# Patient Record
Sex: Male | Born: 1950 | Race: Black or African American | Hispanic: No | Marital: Single | Smoking: Former smoker
Health system: Southern US, Community
[De-identification: ages and names within clinical notes are randomized; demographics above are authoritative.]

## PROBLEM LIST (undated history)

## (undated) DIAGNOSIS — J449 Chronic obstructive pulmonary disease, unspecified: Secondary | ICD-10-CM

## (undated) DIAGNOSIS — E7800 Pure hypercholesterolemia, unspecified: Secondary | ICD-10-CM

## (undated) DIAGNOSIS — N289 Disorder of kidney and ureter, unspecified: Secondary | ICD-10-CM

## (undated) DIAGNOSIS — D649 Anemia, unspecified: Secondary | ICD-10-CM

## (undated) DIAGNOSIS — M1990 Unspecified osteoarthritis, unspecified site: Secondary | ICD-10-CM

## (undated) DIAGNOSIS — I2699 Other pulmonary embolism without acute cor pulmonale: Secondary | ICD-10-CM

## (undated) DIAGNOSIS — I10 Essential (primary) hypertension: Secondary | ICD-10-CM

## (undated) DIAGNOSIS — E782 Mixed hyperlipidemia: Secondary | ICD-10-CM

## (undated) DIAGNOSIS — M109 Gout, unspecified: Secondary | ICD-10-CM

## (undated) DIAGNOSIS — R0602 Shortness of breath: Secondary | ICD-10-CM

## (undated) DIAGNOSIS — J45909 Unspecified asthma, uncomplicated: Secondary | ICD-10-CM

## (undated) DIAGNOSIS — R251 Tremor, unspecified: Secondary | ICD-10-CM

## (undated) DIAGNOSIS — E790 Hyperuricemia without signs of inflammatory arthritis and tophaceous disease: Secondary | ICD-10-CM

## (undated) HISTORY — DX: Mixed hyperlipidemia: E78.2

## (undated) HISTORY — PX: OTHER SURGICAL HISTORY: SHX169

## (undated) HISTORY — DX: Essential (primary) hypertension: I10

## (undated) HISTORY — PX: ACHILLES TENDON REPAIR: SUR1153

## (undated) HISTORY — DX: Other pulmonary embolism without acute cor pulmonale: I26.99

## (undated) HISTORY — PX: CARDIAC CATHETERIZATION: SHX172

---
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
## 2006-07-23 ENCOUNTER — Ambulatory Visit: Payer: Self-pay | Admitting: Cardiology

## 2006-08-12 ENCOUNTER — Ambulatory Visit: Payer: Self-pay | Admitting: Cardiology

## 2006-08-25 ENCOUNTER — Ambulatory Visit: Payer: Self-pay | Admitting: Cardiology

## 2006-09-03 ENCOUNTER — Inpatient Hospital Stay (HOSPITAL_BASED_OUTPATIENT_CLINIC_OR_DEPARTMENT_OTHER): Admission: RE | Admit: 2006-09-03 | Discharge: 2006-09-03 | Payer: Self-pay | Admitting: Cardiology

## 2006-09-03 ENCOUNTER — Ambulatory Visit: Payer: Self-pay | Admitting: Cardiology

## 2006-09-11 ENCOUNTER — Ambulatory Visit: Payer: Self-pay | Admitting: Cardiology

## 2011-12-30 HISTORY — PX: CARDIAC CATHETERIZATION: SHX172

## 2011-12-30 HISTORY — PX: ACHILLES TENDON REPAIR: SUR1153

## 2012-08-20 DIAGNOSIS — R0602 Shortness of breath: Secondary | ICD-10-CM

## 2012-09-02 ENCOUNTER — Emergency Department (HOSPITAL_COMMUNITY): Payer: 59

## 2012-09-02 ENCOUNTER — Encounter (HOSPITAL_COMMUNITY): Payer: Self-pay

## 2012-09-02 ENCOUNTER — Inpatient Hospital Stay (HOSPITAL_COMMUNITY)
Admission: EM | Admit: 2012-09-02 | Discharge: 2012-09-05 | DRG: 176 | Disposition: A | Discharge status: Home / Self Care | Payer: 59 | Attending: Internal Medicine | Admitting: Internal Medicine

## 2012-09-02 DIAGNOSIS — Y9210 Unspecified residential institution as the place of occurrence of the external cause: Secondary | ICD-10-CM | POA: Diagnosis present

## 2012-09-02 DIAGNOSIS — R000 Tachycardia, unspecified: Secondary | ICD-10-CM | POA: Diagnosis present

## 2012-09-02 DIAGNOSIS — I2692 Saddle embolus of pulmonary artery without acute cor pulmonale: Principal | ICD-10-CM

## 2012-09-02 DIAGNOSIS — N189 Chronic kidney disease, unspecified: Secondary | ICD-10-CM

## 2012-09-02 DIAGNOSIS — Z79899 Other long term (current) drug therapy: Secondary | ICD-10-CM

## 2012-09-02 DIAGNOSIS — Z8739 Personal history of other diseases of the musculoskeletal system and connective tissue: Secondary | ICD-10-CM

## 2012-09-02 DIAGNOSIS — G4440 Drug-induced headache, not elsewhere classified, not intractable: Secondary | ICD-10-CM | POA: Diagnosis present

## 2012-09-02 DIAGNOSIS — M129 Arthropathy, unspecified: Secondary | ICD-10-CM | POA: Diagnosis present

## 2012-09-02 DIAGNOSIS — I10 Essential (primary) hypertension: Secondary | ICD-10-CM

## 2012-09-02 DIAGNOSIS — R0600 Dyspnea, unspecified: Secondary | ICD-10-CM | POA: Diagnosis present

## 2012-09-02 DIAGNOSIS — I129 Hypertensive chronic kidney disease with stage 1 through stage 4 chronic kidney disease, or unspecified chronic kidney disease: Secondary | ICD-10-CM | POA: Diagnosis present

## 2012-09-02 DIAGNOSIS — D649 Anemia, unspecified: Secondary | ICD-10-CM

## 2012-09-02 DIAGNOSIS — I82409 Acute embolism and thrombosis of unspecified deep veins of unspecified lower extremity: Secondary | ICD-10-CM | POA: Diagnosis present

## 2012-09-02 DIAGNOSIS — E7800 Pure hypercholesterolemia, unspecified: Secondary | ICD-10-CM | POA: Diagnosis present

## 2012-09-02 DIAGNOSIS — M109 Gout, unspecified: Secondary | ICD-10-CM | POA: Diagnosis present

## 2012-09-02 DIAGNOSIS — N183 Chronic kidney disease, stage 3 unspecified: Secondary | ICD-10-CM

## 2012-09-02 DIAGNOSIS — I2699 Other pulmonary embolism without acute cor pulmonale: Secondary | ICD-10-CM

## 2012-09-02 DIAGNOSIS — T45515A Adverse effect of anticoagulants, initial encounter: Secondary | ICD-10-CM | POA: Diagnosis present

## 2012-09-02 DIAGNOSIS — E785 Hyperlipidemia, unspecified: Secondary | ICD-10-CM

## 2012-09-02 HISTORY — DX: Hyperuricemia without signs of inflammatory arthritis and tophaceous disease: E79.0

## 2012-09-02 HISTORY — DX: Pure hypercholesterolemia, unspecified: E78.00

## 2012-09-02 HISTORY — DX: Chronic obstructive pulmonary disease, unspecified: J44.9

## 2012-09-02 HISTORY — DX: Essential (primary) hypertension: I10

## 2012-09-02 HISTORY — DX: Tremor, unspecified: R25.1

## 2012-09-02 HISTORY — DX: Anemia, unspecified: D64.9

## 2012-09-02 HISTORY — DX: Gout, unspecified: M10.9

## 2012-09-02 HISTORY — DX: Disorder of kidney and ureter, unspecified: N28.9

## 2012-09-02 HISTORY — DX: Unspecified osteoarthritis, unspecified site: M19.90

## 2012-09-02 HISTORY — DX: Unspecified asthma, uncomplicated: J45.909

## 2012-09-02 HISTORY — DX: Shortness of breath: R06.02

## 2012-09-02 LAB — CBC WITH DIFFERENTIAL/PLATELET
Basophils Absolute: 0 10*3/uL (ref 0.0–0.1)
Basophils Relative: 0 % (ref 0–1)
HCT: 44 % (ref 39.0–52.0)
Lymphocytes Relative: 12 % (ref 12–46)
Neutro Abs: 8.2 10*3/uL — ABNORMAL HIGH (ref 1.7–7.7)
Neutrophils Relative %: 77 % (ref 43–77)
Platelets: 235 10*3/uL (ref 150–400)
RDW: 13.2 % (ref 11.5–15.5)
WBC: 10.7 10*3/uL — ABNORMAL HIGH (ref 4.0–10.5)

## 2012-09-02 LAB — PROTIME-INR: Prothrombin Time: 15.8 seconds — ABNORMAL HIGH (ref 11.6–15.2)

## 2012-09-02 LAB — COMPREHENSIVE METABOLIC PANEL
Alkaline Phosphatase: 120 U/L — ABNORMAL HIGH (ref 39–117)
BUN: 18 mg/dL (ref 6–23)
CO2: 25 mEq/L (ref 19–32)
GFR calc Af Amer: 50 mL/min — ABNORMAL LOW (ref >90)
GFR calc non Af Amer: 43 mL/min — ABNORMAL LOW (ref >90)
Glucose, Bld: 89 mg/dL (ref 70–99)
Potassium: 4.4 mEq/L (ref 3.5–5.1)
Total Bilirubin: 0.7 mg/dL (ref 0.3–1.2)
Total Protein: 7.9 g/dL (ref 6.0–8.3)

## 2012-09-02 LAB — D-DIMER, QUANTITATIVE: D-Dimer, Quant: 11.41 ug/mL-FEU — ABNORMAL HIGH (ref 0.00–0.48)

## 2012-09-02 LAB — TROPONIN I: Troponin I: <0.3 ng/mL (ref <0.30)

## 2012-09-02 LAB — PRO B NATRIURETIC PEPTIDE: Pro B Natriuretic peptide (BNP): 722.9 pg/mL — ABNORMAL HIGH (ref 0–125)

## 2012-09-02 MED ORDER — HEPARIN BOLUS VIA INFUSION: 80.0000 [IU]/kg | Freq: Once | INTRAVENOUS | Status: DC

## 2012-09-02 MED ORDER — HEPARIN BOLUS VIA INFUSION
5000.0000 [IU] | Freq: Once | INTRAVENOUS | Status: AC
  Administered 2012-09-02: 5000 [IU] via INTRAVENOUS

## 2012-09-02 MED ORDER — SODIUM CHLORIDE 0.9 % IV BOLUS (SEPSIS)
500.0000 mL | Freq: Once | INTRAVENOUS | Status: AC
  Administered 2012-09-02: 500 mL via INTRAVENOUS

## 2012-09-02 MED ORDER — IOHEXOL 350 MG/ML SOLN
100.0000 mL | Freq: Once | INTRAVENOUS | Status: AC | PRN
  Administered 2012-09-02: 100 mL via INTRAVENOUS

## 2012-09-02 MED ORDER — HEPARIN (PORCINE) IN NACL 100-0.45 UNIT/ML-% IJ SOLN: 18.0000 [IU]/kg/h | INTRAMUSCULAR | Status: DC

## 2012-09-02 MED ORDER — HEPARIN (PORCINE) IN NACL 100-0.45 UNIT/ML-% IJ SOLN
1750.0000 [IU]/h | INTRAMUSCULAR | Status: AC
  Administered 2012-09-02: 1550 [IU]/h via INTRAVENOUS
  Administered 2012-09-03: 1750 [IU]/h via INTRAVENOUS
  Filled 2012-09-02 (×3): qty 250

## 2012-09-02 NOTE — ED Notes (Signed)
Pt reports SOB, pain to (L) back in the rib cage, fevers, heart palpitations, fatigue and night sweats starting August 15th, pt had surgery to (R) achillis tendon August 6. Pt sent to ED d/t elevated BNP. Pt brought lab report w/him, BNP was not included in lab report. Pt reports waking up in the middle of the night w/sudden onset of (R) leg pain 2 nights ago, pt's orthopedic evaluated pt's RLE in office

## 2012-09-02 NOTE — ED Notes (Signed)
Pt also reports chest wall discomfort and a dry cough x1 month, pt reports the cough started after leaving the hospital but has gotten progressively worse over the last few days, increase cough with sitting

## 2012-09-02 NOTE — Progress Notes (Signed)
ANTICOAGULATION CONSULT NOTE - Initial Consult  Pharmacy Consult for heparin Indication: pulmonary embolus  No Known Allergies  Patient Measurements: Height: 6' (182.9 cm) Weight: 190 lb (86.183 kg) (Per patient) IBW/kg (Calculated) : 77.6  Heparin Dosing Weight: 86 kg  Vital Signs: Temp: 98.7 F (37.1 C) (09/05 1906) Temp src: Oral (09/05 1906) BP: 151/79 mmHg (09/05 2300) Pulse Rate: 106  (09/05 2300)  Labs:  Basename 09/02/12 2138 09/02/12 2132 09/02/12 1932  HGB -- -- 15.2  HCT -- -- 44.0  PLT -- -- 235  APTT -- 31 --  LABPROT -- 15.8* --  INR -- 1.23 --  HEPARINUNFRC -- -- --  CREATININE -- -- 1.67*  CKTOTAL -- -- --  CKMB -- -- --  TROPONINI <0.30 -- --    Estimated Creatinine Clearance: 52.3 ml/min (by C-G formula based on Cr of 1.67).   Medical History: Past Medical History  Diagnosis Date  . Hypertension   . Gout   . Hypercholesterolemia   . Hyperuricemia   . Tremor   . Arthritis   . Renal disorder     Medications:  Scheduled:    . heparin  80 Units/kg Intravenous Once  . sodium chloride  500 mL Intravenous Once    Assessment: 61 yo male admitted with large bilateral pulmonary emboli including saddle emboli per CT angio. Pharmacy consulted to manage IV heparin.   Goal of Therapy:  Heparin level 0.3-0.7 units/ml Monitor platelets by anticoagulation protocol: Yes   Plan:  1. Heparin IV bolus of 5000 units x 1, then IV infusion of 1550 units/hr.  2. Heparin level in 6 hours. 3. Daily CBC, heparin level  Troy Gaines, Troy Gaines 09/02/2012,11:19 PM

## 2012-09-02 NOTE — ED Provider Notes (Signed)
History     CSN: [id number]  Arrival date & time 09/02/12  4098   First MD Initiated Contact with Patient 09/02/12 2100      Chief Complaint  Patient presents with  . Abnormal Lab    (Consider location/radiation/quality/duration/timing/severity/associated sxs/prior treatment) HPI Pt sent from [facility] [place] for elevated D-dimer. Pt had R achilles surgery in early August and then developed sharp L -sided chest pain radiating to back. States he is having increased RLE pain up in inner thigh area. + low grade fever and diaphoresis. Symptoms have been constant since onset Past Medical History  Diagnosis Date  . Hypertension   . Gout   . Hypercholesterolemia   . Hyperuricemia   . Tremor   . Arthritis   . Renal disorder     Past Surgical History  Procedure Date  . Achillis tendon     History reviewed. No pertinent family history.  History  Substance Use Topics  . Smoking status: Never Smoker   . Smokeless tobacco: Not on file  . Alcohol Use: Yes     occasional      Review of Systems  Constitutional: Positive for fever and diaphoresis.  HENT: Negative for neck pain.   Respiratory: Positive for shortness of breath. Negative for cough, wheezing and stridor.   Cardiovascular: Positive for chest pain. Negative for palpitations and leg swelling.  Gastrointestinal: Negative for nausea, vomiting and abdominal pain.  Musculoskeletal: Positive for back pain.  Skin: Negative for rash.  Neurological: Negative for dizziness, weakness, light-headedness, numbness and headaches.    Allergies  Review of patient's allergies indicates no known allergies.  Home Medications   Current Outpatient Rx  Name Route Sig Dispense Refill  . LEVOFLOXACIN 500 MG PO TABS Oral Take 500 mg by mouth daily.    Marland Kitchen METOPROLOL SUCCINATE ER 50 MG PO TB24 Oral Take 50 mg by mouth daily. Take with or immediately following a meal.    . PRAVASTATIN SODIUM 40 MG PO TABS Oral Take 40 mg by mouth daily.    .  SULFAMETHOXAZOLE-TMP DS 800-160 MG PO TABS Oral Take 1 tablet by mouth 2 (two) times daily.    Marland Kitchen TAMSULOSIN HCL 0.4 MG PO CAPS Oral Take 0.4 mg by mouth at bedtime.      BP 135/77  Pulse 102  Temp 98.7 F (37.1 C) (Oral)  Resp 18  SpO2 98%  Physical Exam  Nursing note and vitals reviewed. Constitutional: He is oriented to person, place, and time. He appears well-developed and well-nourished. No distress.  HENT:  Head: Normocephalic and atraumatic.  Mouth/Throat: Oropharynx is clear and moist.  Eyes: EOM are normal. Pupils are equal, round, and reactive to light.  Neck: Normal range of motion. Neck supple.  Cardiovascular: Normal rate and regular rhythm.   Pulmonary/Chest: Effort normal and breath sounds normal. No respiratory distress. He has no wheezes. He has no rales. He exhibits no tenderness.  Abdominal: Soft. Bowel sounds are normal. There is no tenderness. There is no rebound and no guarding.  Musculoskeletal: Normal range of motion. He exhibits tenderness (TTP R medial thigh. No injury, swelling or erythema). He exhibits no edema.  Neurological: He is alert and oriented to person, place, and time.       5/5 motor, sensation intact  Skin: Skin is warm and dry. No rash noted. No erythema.  Psychiatric: He has a normal mood and affect. His behavior is normal.    ED Course  Procedures (including critical care time)  Labs Reviewed  COMPREHENSIVE METABOLIC PANEL - Abnormal; Notable for the following:    Creatinine, Ser 1.67 (*)     Albumin 3.4 (*)     Alkaline Phosphatase 120 (*)     GFR calc non Af Amer 43 (*)     GFR calc Af Amer 50 (*)     All other components within normal limits  CBC WITH DIFFERENTIAL - Abnormal; Notable for the following:    WBC 10.7 (*)     Neutro Abs 8.2 (*)     Monocytes Absolute 1.1 (*)     All other components within normal limits  PRO B NATRIURETIC PEPTIDE - Abnormal; Notable for the following:    Pro B Natriuretic peptide (BNP) 722.9 (*)      All other components within normal limits  D-DIMER, QUANTITATIVE - Abnormal; Notable for the following:    D-Dimer, Quant 11.41 (*)     All other components within normal limits  PROTIME-INR - Abnormal; Notable for the following:    Prothrombin Time 15.8 (*)     All other components within normal limits  TROPONIN I  APTT   Dg Chest 2 View  09/02/2012  *RADIOLOGY REPORT*  Clinical Data: Decreased energy with cough and shortness of breath for 1 month.  CHEST - 2 VIEW  Comparison: 08/20/2012.  Findings: Mild patient rotation to the right.  Heart size and mediastinal contours are stable.  The lungs are hyperinflated but clear.  There is chronic blunting of the right costophrenic angle. There is stable asymmetric sclerosis of the right first costochondral junction.  No acute osseous findings are seen.  IMPRESSION: Chronic obstructive pulmonary disease.  No acute cardiopulmonary process.   Original Report Authenticated By: Gerrianne Scale, M.D.    Ct Angio Chest W/cm &/or Wo Cm  09/02/2012  *RADIOLOGY REPORT*  Clinical Data: Shortness of breath, chest pain, cough, post right ankle tendons surgery 08/03/2012  CT ANGIOGRAPHY CHEST  Technique:  Multidetector CT imaging of the chest using the standard protocol during bolus administration of intravenous contrast. Multiplanar reconstructed images including MIPs were obtained and reviewed to evaluate the vascular anatomy.  Contrast: OMNIPAQUE IOHEXOL 350 MG/ML SOLN  Comparison: None  Findings: Aorta normal caliber without aneurysm or dissection. Large bilateral pulmonary emboli are identified at the bifurcations of the left and right pulmonary arteries. Emboli extend into the left lower lobe and left upper lobe pulmonary artery branches as well as the descending interlobar pulmonary artery. Small pericardial effusion. Question minimal right ventricular dilatation. Visualized portion upper abdomen normal appearance. No thoracic adenopathy. Subsegmental  atelectasis bilateral lower lobes. Small bilateral pleural effusions. Remaining lungs clear. No acute osseous findings.  IMPRESSION: Large bilateral pulmonary emboli including saddle emboli at the bifurcations of the left greater than right pulmonary arteries. Associated small bilateral pleural effusions and bibasilar atelectasis. Small pericardial effusion.  Critical Value/emergent results were called by telephone at the time of interpretation on 09/02/2012 at 2252 to Dr. Ranae Palms, who verbally acknowledged these results.   Original Report Authenticated By: Lollie Marrow, M.D.      1. Pulmonary embolism, bilateral       MDM  Discussed with [facility] who will admit to stepdown        Loren Racer, MD 09/02/12 2306

## 2012-09-03 ENCOUNTER — Encounter (HOSPITAL_COMMUNITY): Payer: Self-pay | Admitting: *Deleted

## 2012-09-03 DIAGNOSIS — I10 Essential (primary) hypertension: Secondary | ICD-10-CM

## 2012-09-03 DIAGNOSIS — R000 Tachycardia, unspecified: Secondary | ICD-10-CM | POA: Diagnosis present

## 2012-09-03 DIAGNOSIS — N183 Chronic kidney disease, stage 3 unspecified: Secondary | ICD-10-CM | POA: Diagnosis present

## 2012-09-03 DIAGNOSIS — E785 Hyperlipidemia, unspecified: Secondary | ICD-10-CM | POA: Diagnosis present

## 2012-09-03 DIAGNOSIS — R0600 Dyspnea, unspecified: Secondary | ICD-10-CM | POA: Diagnosis present

## 2012-09-03 DIAGNOSIS — Z8739 Personal history of other diseases of the musculoskeletal system and connective tissue: Secondary | ICD-10-CM

## 2012-09-03 DIAGNOSIS — I2699 Other pulmonary embolism without acute cor pulmonale: Secondary | ICD-10-CM

## 2012-09-03 DIAGNOSIS — I2692 Saddle embolus of pulmonary artery without acute cor pulmonale: Secondary | ICD-10-CM | POA: Diagnosis present

## 2012-09-03 DIAGNOSIS — N189 Chronic kidney disease, unspecified: Secondary | ICD-10-CM

## 2012-09-03 DIAGNOSIS — D649 Anemia, unspecified: Secondary | ICD-10-CM

## 2012-09-03 HISTORY — DX: Anemia, unspecified: D64.9

## 2012-09-03 LAB — URINALYSIS, ROUTINE W REFLEX MICROSCOPIC
Ketones, ur: 40 mg/dL — AB
Leukocytes, UA: NEGATIVE
Nitrite: NEGATIVE
Protein, ur: NEGATIVE mg/dL
Urobilinogen, UA: 0.2 mg/dL (ref 0.0–1.0)

## 2012-09-03 LAB — BASIC METABOLIC PANEL
CO2: 25 mEq/L (ref 19–32)
Calcium: 8.8 mg/dL (ref 8.4–10.5)
Chloride: 105 mEq/L (ref 96–112)
Creatinine, Ser: 1.51 mg/dL — ABNORMAL HIGH (ref 0.50–1.35)
Glucose, Bld: 104 mg/dL — ABNORMAL HIGH (ref 70–99)

## 2012-09-03 LAB — CBC
HCT: 35.5 % — ABNORMAL LOW (ref 39.0–52.0)
MCH: 27.7 pg (ref 26.0–34.0)
MCV: 79.4 fL (ref 78.0–100.0)
Platelets: 184 10*3/uL (ref 150–400)
RBC: 4.47 MIL/uL (ref 4.22–5.81)
RDW: 13.2 % (ref 11.5–15.5)
WBC: 9.5 10*3/uL (ref 4.0–10.5)

## 2012-09-03 LAB — MRSA PCR SCREENING: MRSA by PCR: NEGATIVE

## 2012-09-03 MED ORDER — SIMVASTATIN 20 MG PO TABS
20.0000 mg | ORAL_TABLET | Freq: Every day | ORAL | Status: DC
  Administered 2012-09-03 – 2012-09-04 (×2): 20 mg via ORAL
  Filled 2012-09-03 (×3): qty 1

## 2012-09-03 MED ORDER — PROMETHAZINE HCL 12.5 MG PO TABS
12.5000 mg | ORAL_TABLET | Freq: Four times a day (QID) | ORAL | Status: DC | PRN
  Filled 2012-09-03: qty 1

## 2012-09-03 MED ORDER — METOPROLOL SUCCINATE ER 50 MG PO TB24
50.0000 mg | ORAL_TABLET | Freq: Every day | ORAL | Status: DC
  Administered 2012-09-03 – 2012-09-04 (×2): 50 mg via ORAL
  Filled 2012-09-03 (×3): qty 1

## 2012-09-03 MED ORDER — RIVAROXABAN 20 MG PO TABS: 20.0000 mg | ORAL_TABLET | Freq: Every day | ORAL | Status: DC

## 2012-09-03 MED ORDER — HEPARIN (PORCINE) IN NACL 100-0.45 UNIT/ML-% IJ SOLN
1750.0000 [IU]/h | INTRAMUSCULAR | Status: AC
  Filled 2012-09-03: qty 250

## 2012-09-03 MED ORDER — TAMSULOSIN HCL 0.4 MG PO CAPS
0.4000 mg | ORAL_CAPSULE | Freq: Every day | ORAL | Status: DC
  Administered 2012-09-03 – 2012-09-04 (×2): 0.4 mg via ORAL
  Filled 2012-09-03 (×4): qty 1

## 2012-09-03 MED ORDER — SODIUM CHLORIDE 0.9 % IJ SOLN
3.0000 mL | Freq: Two times a day (BID) | INTRAMUSCULAR | Status: DC
  Administered 2012-09-03: 3 mL via INTRAVENOUS

## 2012-09-03 MED ORDER — SODIUM CHLORIDE 0.9 % IV SOLN
INTRAVENOUS | Status: AC
  Administered 2012-09-03 (×2): via INTRAVENOUS

## 2012-09-03 MED ORDER — ALUM & MAG HYDROXIDE-SIMETH 200-200-20 MG/5ML PO SUSP
30.0000 mL | Freq: Four times a day (QID) | ORAL | Status: DC | PRN
  Filled 2012-09-03: qty 30

## 2012-09-03 MED ORDER — POLYETHYLENE GLYCOL 3350 17 G PO PACK
17.0000 g | PACK | Freq: Every day | ORAL | Status: DC | PRN
  Filled 2012-09-03: qty 1

## 2012-09-03 MED ORDER — RIVAROXABAN 15 MG PO TABS
15.0000 mg | ORAL_TABLET | Freq: Two times a day (BID) | ORAL | Status: DC
  Administered 2012-09-03 – 2012-09-05 (×4): 15 mg via ORAL
  Filled 2012-09-03 (×8): qty 1

## 2012-09-03 MED ORDER — HYDROCODONE-ACETAMINOPHEN 5-325 MG PO TABS
1.0000 | ORAL_TABLET | ORAL | Status: DC | PRN
Start: 2012-09-03 — End: 2012-09-05

## 2012-09-03 NOTE — Progress Notes (Addendum)
ANTICOAGULATION CONSULT NOTE - Follow Up Consult  Pharmacy Consult for heparin Indication: pulmonary embolus  No Known Allergies  Patient Measurements: Height: 6' (182.9 cm) Weight: 190 lb 14.7 oz (86.6 kg) IBW/kg (Calculated) : 77.6  Heparin Dosing Weight: 86kg  Vital Signs: Temp: 99.7 F (37.6 C) (09/06 0404) Temp src: Oral (09/06 0404) BP: 131/75 mmHg (09/06 0700) Pulse Rate: 94  (09/06 0700)  Labs:  Basename 09/03/12 0645 09/02/12 2138 09/02/12 2132 09/02/12 1932  HGB 12.4* -- -- 15.2  HCT 35.5* -- -- 44.0  PLT 184 -- -- 235  APTT -- -- 31 --  LABPROT -- -- 15.8* --  INR -- -- 1.23 --  HEPARINUNFRC 0.25* -- -- --  CREATININE -- -- -- 1.67*  CKTOTAL -- -- -- --  CKMB -- -- -- --  TROPONINI -- <0.30 -- --    Estimated Creatinine Clearance: 52.3 ml/min (by C-G formula based on Cr of 1.67).   Medications:  Scheduled:    . heparin  5,000 Units Intravenous Once  . metoprolol succinate  50 mg Oral Daily  . simvastatin  20 mg Oral q1800  . sodium chloride  500 mL Intravenous Once  . sodium chloride  3 mL Intravenous Q12H  . Tamsulosin HCl  0.4 mg Oral QHS  . DISCONTD: heparin  80 Units/kg Intravenous Once    Assessment: 61 yo male with bilateral PE on heparin and initial level is 0.26 on 1550 units/hr. Hg/Hct= 12.4/35.5 today (15.2/44 on 9/5; likely dilutional effect).  Goal of Therapy:  Heparin level 0.3-0.7 units/ml Monitor platelets by anticoagulation protocol: Yes   Plan:  -Increase heparin to 1750 units/hr -Heparin level in 6hrs  Harland German, Pharm D 09/03/2012 7:54 AM  Addenum: changing to Xarelto for PE  Plan -Will start Xarelto 15mg  bid at noon with lunch for a total of 3 weeks then change to 20mg  po daily -Will d/c heparin when Xarelto begins -Will educate patient  Earnest Bailey D 09/03/2012 9:31 AM

## 2012-09-03 NOTE — Plan of Care (Signed)
Problem: Consults Goal: Diagnosis - Venous Thromboembolism (VTE) Choose a selection Outcome: Completed/Met Date Met:  09/03/12 PE (Pulmonary Embolism)

## 2012-09-03 NOTE — Progress Notes (Addendum)
[facility] Progress Note [place] TEAM 1 - Stepdown/ICU TEAM   Drucie Ip ZOX:[id number] DOB: Jul 18, 1951 DOA: 09/02/2012 PCP: Louie Boston, MD  Brief narrative: Healthy 61 year old male patient with recent history of surgery for ruptured Achilles tendon in August. Had been experiencing malaise and dyspnea on exertion as well as sharp left-sided chest pain. Follow up with his family physician in [place] and a d-dimer was obtained which was markedly elevated at greater than 17. Because of concerns for possible pulmonary embolism patient was sent to [facility] for further evaluation. CT of the chest revealed large bilateral pulmonary emboli including saddle emboli at the bifurcations with left being greater than right. Surprisingly in the emergency department the patient was not hypoxic although he was tachycardic. He was admitted to the step down unit for further monitoring and evaluation.  Assessment/Plan:  Saddle embolism of pulmonary artery- bilateral *Suspect etiology of DVT and subsequent PE directly related to recent surgical procedure and limited mobility *Hemodynamically stable and no signs of right heart strain and troponin is normal so no indications for 2-D echocardiogram at this point *Initially started on IV heparin but have requested pharmacy assistance in dosing Xarelto since patient has chronic kidney disease - he will start Xarelto today *Suspect we'll need 6 months total outpatient anticoagulation and since obvious etiology for DVT/PE do not anticipate further hypercoagulable workup is indicated  Dyspnea *Improved and directly related to PE  Tachycardia *Resolved and was primarily related to stress of PE  Hypertension *Currently well controlled on metoprolol  CKD (chronic kidney disease) stage 3, GFR 30-59 ml/min *Baseline creatinine unknown but currently has criteria to meet stage III chronic kidney disease *Patient did receive IV contrast with CT  scan so will continue IV fluids at 50 cc per hour  Hyperlipidemia *Continue simvastatin  History of gout *Currently not exacerbated - prior to admission was not on chronic suppressive medications  Anemia -baseline hemoglobin unknown *Hemoglobin has dropped nearly 3 g after admission - hydration likely explains hemoglobin drop *Since patient will be receiving anticoagulation will need to continue to monitor hemoglobin and proceed with workup if further decreases are noted  DVT prophylaxis: On IV heparin transitioning to Xarelto Code Status: Full Disposition Plan: Transfer to telemetry, but given significant clot burden feel that continued intpt tele monitoring is indicated  Consultants: None  Procedures: None  Antibiotics: None  HPI/Subjective: Patient alert and denies any rest dyspnea. Previous chest pain has resolved. Multiple questions regarding patient's current disease state and plans for treatment discussed by my attending physician.   Objective: Blood pressure 132/71, pulse 88, temperature 99.7 F (37.6 C), temperature source Oral, resp. rate 20, height 6' (1.829 m), weight 86.6 kg (190 lb 14.7 oz), SpO2 98.00%.  Intake/Output Summary (Last 24 hours) at 09/03/12 1134 Last data filed at 09/03/12 1000  Gross per 24 hour  Intake 775.25 ml  Output    875 ml  Net -99.75 ml     Exam: Patient admitted in past 24 hours there for full physical examination deferred  Data Reviewed: Basic Metabolic Panel:  Lab 09/03/12 8119 09/02/12 1932  NA 141 137  K 3.5 4.4  CL 105 98  CO2 25 25  GLUCOSE 104* 89  BUN 14 18  CREATININE 1.51* 1.67*  CALCIUM 8.8 10.0  MG -- --  PHOS -- --   Liver Function Tests:  Lab 09/02/12 1932  AST 24  ALT 17  ALKPHOS 120*  BILITOT 0.7  PROT 7.9  ALBUMIN 3.4*  CBC:  Lab 09/03/12 0645 09/02/12 1932  WBC 9.5 10.7*  NEUTROABS -- 8.2*  HGB 12.4* 15.2  HCT 35.5* 44.0  MCV 79.4 80.7  PLT 184 235   Cardiac Enzymes:  Lab  09/02/12 2138  CKTOTAL --  CKMB --  CKMBINDEX --  TROPONINI <0.30   BNP (last 3 results)  Basename 09/02/12 1931  PROBNP 722.9*   CBG:  Lab 09/03/12 0111  GLUCAP 107*    Recent Results (from the past 240 hour(s))  MRSA PCR SCREENING     Status: Normal   Collection Time   09/03/12  1:19 AM      Component Value Range Status Comment   MRSA by PCR NEGATIVE  NEGATIVE Final      Studies:  Recent x-ray studies have been reviewed in detail by the Attending Physician  Scheduled Meds:  Reviewed in detail by the Attending Physician   Junious Silk, ANP [facility] Office  [phone] Pager [phone]  On-Call/Text Page:      [email or web address]      password [email or web address]  If 7PM-7AM, please contact night-coverage [email or web address] Password [email or web address] 09/03/2012, 11:34 AM   LOS: 1 day   I have personally examined this patient and reviewed the entire database. I have reviewed the above note, made any necessary editorial changes, and agree with its content.  Lonia Blood, MD [facility]
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
## 2012-09-04 DIAGNOSIS — N183 Chronic kidney disease, stage 3 unspecified: Secondary | ICD-10-CM

## 2012-09-04 LAB — BASIC METABOLIC PANEL WITH GFR
BUN: 14 mg/dL (ref 6–23)
CO2: 23 meq/L (ref 19–32)
Calcium: 8.9 mg/dL (ref 8.4–10.5)
Chloride: 105 meq/L (ref 96–112)
Creatinine, Ser: 1.42 mg/dL — ABNORMAL HIGH (ref 0.50–1.35)
GFR calc Af Amer: 61 mL/min — ABNORMAL LOW
GFR calc non Af Amer: 53 mL/min — ABNORMAL LOW
Glucose, Bld: 95 mg/dL (ref 70–99)
Potassium: 3.9 meq/L (ref 3.5–5.1)
Sodium: 139 meq/L (ref 135–145)

## 2012-09-04 LAB — CBC
HCT: 33.7 % — ABNORMAL LOW (ref 39.0–52.0)
MCHC: 33.8 g/dL (ref 30.0–36.0)
MCV: 80 fL (ref 78.0–100.0)
RDW: 13.1 % (ref 11.5–15.5)

## 2012-09-04 MED ORDER — ACETAMINOPHEN 325 MG PO TABS: 650.0000 mg | ORAL_TABLET | Freq: Four times a day (QID) | ORAL | Status: DC | PRN
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
## 2012-09-04 NOTE — Progress Notes (Signed)
Pt ambulated on RA- sats down to 87% on room air.  Pt sitting, recovered to 98% on RA after 1 minute.  Will cont to monitor.  Darlen Gledhill, [facility]

## 2012-09-04 NOTE — Progress Notes (Signed)
Pt ambulated with crutches.  O2 sats during ambulation 96% on RA.  Will cont to monitor.  Lurae Hornbrook, [facility]

## 2012-09-05 DIAGNOSIS — D649 Anemia, unspecified: Secondary | ICD-10-CM

## 2012-09-05 MED ORDER — RIVAROXABAN 20 MG PO TABS: 20.0000 mg | ORAL_TABLET | Freq: Every day | ORAL | Status: DC

## 2012-09-05 MED ORDER — HYDROCODONE-ACETAMINOPHEN 5-325 MG PO TABS: 1.0000 | ORAL_TABLET | ORAL | Status: AC | PRN

## 2012-09-05 NOTE — Progress Notes (Signed)
   CARE MANAGEMENT NOTE 09/05/2012  Patient:  Troy Gaines, Troy Gaines   Account Number:  [id number]  Date Initiated:  09/05/2012  Documentation initiated by:  [facility]  Subjective/Objective Assessment:   DVT, PE     Action/Plan:   Anticipated DC Date:  09/05/2012   Anticipated DC Plan:  HOME W HOME HEALTH SERVICES      DC Planning Services  CM consult  Medication Assistance      Choice offered to / List presented to:             Status of service:  Completed, signed off Medicare Important Message given?   (If response is "NO", the following Medicare IM given date fields will be blank) Date Medicare IM given:   Date Additional Medicare IM given:    Discharge Disposition:  HOME/SELF CARE  Per UR Regulation:    If discussed at Long Length of Stay Meetings, dates discussed:    Comments:  09/05/2012 0900 Spoke to pt and he uses [facility] in [place], [phone] on [address], [phone] on [address]. Pharmacy is not open until 10 am. Will give pt a Xarelto $10 copay assistance card. and 10 day free trial card. Pt will need to mail in his 90 day Rx card to [facility] for mail order pharmacy. Will need separate RX for each card. Will try is pharmacy at 10 am to see if they are open. Isidoro Donning RN CCM Case Mgmt phone [phone]

## 2012-09-05 NOTE — Discharge Summary (Signed)
Physician Discharge Summary  Troy Gaines XBJ:[id number] DOB: 25-Mar-1951 DOA: 09/02/2012  PCP: Louie Boston, MD  Admit date: 09/02/2012 Discharge date: 09/05/2012  Recommendations for Outpatient Follow-up:  1. Patient will follow up with his primary care physician in the next few weeks.  Discharge Diagnoses:  Principal Problem:  *Saddle embolism of pulmonary artery- bilateral Active Problems:  Hypertension  Hyperlipidemia  CKD (chronic kidney disease) stage 3, GFR 30-59 ml/min  History of gout  Anemia -baseline hemoglobin unknown  Dyspnea  Tachycardia   Discharge Condition: Improved, being discharged home  Diet recommendation: Low sodium heart healthy  Filed Weights   09/02/12 2300 09/03/12 0100 09/03/12 0600  Weight: 86.183 kg (190 lb) 86.6 kg (190 lb 14.7 oz) 86.6 kg (190 lb 14.7 oz)    History of present illness:  Healthy 61 year old male patient with recent history of surgery for ruptured Achilles tendon in August. Had been experiencing malaise and dyspnea on exertion as well as sharp left-sided chest pain. Follow up with his family physician in [place] and a d-dimer was obtained which was markedly elevated at greater than 17. Because of concerns for possible pulmonary embolism patient was sent to [facility] for further evaluation. CT of the chest revealed large bilateral pulmonary emboli including saddle emboli at the bifurcations with left being greater than right. Surprisingly in the emergency department the patient was not hypoxic although he was tachycardic. He was admitted to the step down unit for further monitoring and evaluation.  Hospital Course:  Saddle embolism of pulmonary artery- bilateral  *Suspect etiology of DVT and subsequent PE directly related to recent surgical procedure and limited mobility  *Hemodynamically stable and no signs of right heart strain and troponin is normal so no indications for 2-D echocardiogram at this point  *Initially started  on IV heparin but have requested pharmacy assistance in dosing Xarelto since patient has chronic kidney disease - he will start Xarelto today  *Suspect we'll need 6 months total outpatient anticoagulation and since obvious etiology for DVT/PE do not anticipate further hypercoagulable workup is indicated.  I discussed this with the patient and felt that since we already have a diagnosis of pulmonary embolus no reason to check CT scan of leg for looking for DVT. We're unable to do Doppler because of cast. Patient agreed for no reason for unnecessary cost or radiation exposure. Patient doing relatively well. The first time the patient was noted to have oxygen desaturation but, it looked like he was getting hypoxic but there is also question of whether he was accurately read because he was also busy holding his crutches. Second attempt to walk several hours later, noted and oxygen saturation 99% with ambulation. He is also 99% on rest. He is feeling comfortable and was able to be discharged on 09/05/12.  Headache  This is felt to be secondary to xarelto which is a known side effect. It resolved after pain medication.   Dyspnea  *Improved and directly related to PE   Tachycardia  *Resolved and was primarily related to stress of PE   Hypertension  *Currently well controlled on metoprolol   CKD (chronic kidney disease) stage 3, GFR 30-59 ml/min  *Baseline creatinine unknown but currently has criteria to meet stage III chronic kidney disease  *Patient did receive IV contrast with CT scan so will continue IV fluids at 50 cc per hour . Baseline creatinine of 1.4 to currently.  Hyperlipidemia  *Continue simvastatin   History of gout  *Currently not exacerbated - prior  to admission was not on chronic suppressive medications   Anemia -baseline hemoglobin unknown  *Hemoglobin has dropped nearly 3 g after admission - hydration likely explains hemoglobin drop.  Patient's hemoglobin was continued to be  monitored.. No drops in blood pressure not felt to be having any kind of bleeding. Hemoglobin has since settled at 11.4.  DVT prophylaxis: On IV heparin transitioning to Xarelto   Procedures:  None  Consultations:  None  Discharge Exam: Filed Vitals:   09/04/12 0500 09/04/12 1630 09/04/12 2130 09/05/12 0600  BP: 109/72 147/83 129/78 111/66  Pulse: 86 84 85 79  Temp: 98 F (36.7 C) 98.3 F (36.8 C) 98.5 F (36.9 C) 98.7 F (37.1 C)  TempSrc: Oral Oral Oral   Resp: 18 20 15 17   Height:      Weight:      SpO2: 96% 98% 100% 98%    General: Alert and oriented x3, no acute distress, looks younger than stated age HEENT: Normocephalic, atraumatic, mucous membranes are moist Cardiovascular:  Regular rate and rhythm, S1-S2 Respiratory: Clear to auscultation bilaterally Abdomen: Soft, nontender, nondistended, positive bowel sounds Extremities: Right lower extremity is in cast after heel tendon surgery. No edema on left  Discharge Instructions  Discharge Orders    Future Orders Please Complete By Expires   Diet - low sodium heart healthy      Increase activity slowly        Medication List  As of 09/05/2012 12:31 PM   STOP taking these medications         levofloxacin 500 MG tablet      sulfamethoxazole-trimethoprim 800-160 MG per tablet         TAKE these medications         HYDROcodone-acetaminophen 5-325 MG per tablet   Commonly known as: NORCO/VICODIN   Take 1 tablet by mouth every 4 (four) hours as needed.      metoprolol succinate 50 MG 24 hr tablet   Commonly known as: TOPROL-XL   Take 50 mg by mouth daily. Take with or immediately following a meal.      pravastatin 40 MG tablet   Commonly known as: PRAVACHOL   Take 40 mg by mouth daily.      Rivaroxaban 20 MG Tabs   Commonly known as: XARELTO   Take 1 tablet (20 mg total) by mouth daily with supper.   Start taking on: 09/24/2012      Tamsulosin HCl 0.4 MG Caps   Commonly known as: FLOMAX   Take 0.4  mg by mouth at bedtime.           Follow-up Information    Follow up with TAPPER,DAVID B, MD. Schedule an appointment as soon as possible for a visit in 1 month.   Contact information:   [address], [place] [address] [phone]           The results of significant diagnostics from this hospitalization (including imaging, microbiology, ancillary and laboratory) are listed below for reference.    Significant Diagnostic Studies: Dg Chest 2 View  09/02/2012  IMPRESSION: Chronic obstructive pulmonary disease.  No acute cardiopulmonary process.   Original Report Authenticated By: Gerrianne Scale, M.D.    Ct Angio Chest W/cm &/or Wo Cm  09/02/2012   IMPRESSION: Large bilateral pulmonary emboli including saddle emboli at the bifurcations of the left greater than right pulmonary arteries. Associated small bilateral pleural effusions and bibasilar atelectasis. Small pericardial effusion.  Critical Value/emergent  results were called by telephone at the time of interpretation on 09/02/2012 at 2252 to Dr. Ranae Palms, who verbally acknowledged these results.   Original Report Authenticated By: Lollie Marrow, M.D.     Microbiology: Recent Results (from the past 240 hour(s))  MRSA PCR SCREENING     Status: Normal   Collection Time   09/03/12  1:19 AM      Component Value Range Status Comment   MRSA by PCR NEGATIVE  NEGATIVE Final      Labs: Basic Metabolic Panel:  Lab 09/04/12 9604 09/03/12 0645 09/02/12 1932  NA 139 141 137  K 3.9 3.5 4.4  CL 105 105 98  CO2 23 25 25   GLUCOSE 95 104* 89  BUN 14 14 18   CREATININE 1.42* 1.51* 1.67*  CALCIUM 8.9 8.8 10.0  MG -- -- --  PHOS -- -- --   Liver Function Tests:  Lab 09/02/12 1932  AST 24  ALT 17  ALKPHOS 120*  BILITOT 0.7  PROT 7.9  ALBUMIN 3.4*   CBC:  Lab 09/04/12 0450 09/03/12 0645 09/02/12 1932  WBC 8.7 9.5 10.7*  NEUTROABS -- -- 8.2*  HGB 11.4* 12.4* 15.2  HCT 33.7* 35.5* 44.0  MCV 80.0 79.4 80.7    PLT 231 184 235   Cardiac Enzymes:  Lab 09/02/12 2138  CKTOTAL --  CKMB --  CKMBINDEX --  TROPONINI <0.30   BNP: BNP (last 3 results)  Basename 09/02/12 1931  PROBNP 722.9*   CBG:  Lab 09/03/12 0111  GLUCAP 107*    Time coordinating discharge: 30 minutes  Signed:  Hollice Espy  [facility] 09/05/2012, 12:31 PM

## 2018-02-04 ENCOUNTER — Encounter (INDEPENDENT_AMBULATORY_CARE_PROVIDER_SITE_OTHER): Payer: Self-pay

## 2018-02-04 ENCOUNTER — Ambulatory Visit (INDEPENDENT_AMBULATORY_CARE_PROVIDER_SITE_OTHER): Payer: BLUE CROSS/BLUE SHIELD | Admitting: Neurology

## 2018-02-04 ENCOUNTER — Other Ambulatory Visit: Payer: Self-pay

## 2018-02-04 ENCOUNTER — Encounter: Payer: Self-pay | Admitting: Neurology

## 2018-02-04 DIAGNOSIS — G250 Essential tremor: Secondary | ICD-10-CM | POA: Diagnosis not present

## 2018-02-04 NOTE — Progress Notes (Signed)
[facility]  PATIENT: Troy Gaines DOB: [birth year]  REFERRING DOCTOR OR PCP:  Dr. Margo Common SOURCE: patient, notes from Dr. Margo Common, lab results  _________________________________   HISTORICAL  CHIEF COMPLAINT:  Chief Complaint  Patient presents with  . Tremors    Here for eval of intermittent, fine tremor in hands, also in legs if he exercises.  First noted several yrs. ago.  His father has a similar tremor/fim    HISTORY OF PRESENT ILLNESS:  I had the pleasure seeing you patient, Troy Gaines, at [facility] for neurologic consultation regarding his tremor.   He is a 67 year old man who began to notice tremors last year.   The tremor is mostly in the hands but will involve all 4 extremities after he works.     It often is mild in the morning and worsens as the day goes on..    He notes that his handwriting is worse with jerking at times.  Sometimes the tremor will be present when using utensils or holding a cup.    Stress will worsen the tremor.   He does not note if alcohol changes the tremor.     He is on metoprolol for hypertension. He has been on it for several years and has not noted if it affects the tremor.  His father has a similar but more intense tremor.     REVIEW OF SYSTEMS: Constitutional: No fevers, chills, sweats, or change in appetite Eyes: No visual changes, double vision, eye pain Ear, nose and throat: No hearing loss, ear pain, nasal congestion, sore throat Cardiovascular: No chest pain, palpitations Respiratory: No shortness of breath at rest or with exertion.   No wheezes GastrointestinaI: No nausea, vomiting, diarrhea, abdominal pain, fecal incontinence Genitourinary: No dysuria, urinary retention or frequency.  No nocturia. Musculoskeletal: No neck pain, back pain Integumentary: No rash, pruritus, skin lesions Neurological: as above Psychiatric: No depression at this time.  No anxiety Endocrine: No  palpitations, diaphoresis, change in appetite, change in weigh or increased thirst Hematologic/Lymphatic: No anemia, purpura, petechiae. Allergic/Immunologic: No itchy/runny eyes, nasal congestion, recent allergic reactions, rashes  ALLERGIES: No Known Allergies  HOME MEDICATIONS:  Current Outpatient Medications:  .  B Complex-Folic Acid (B COMPLEX-VITAMIN B12 PO), Take by mouth., Disp: , Rfl:  .  predniSONE (DELTASONE) 20 MG tablet, TAKE 2 TABLETS ONCE DAILY WITH FOOD, Disp: , Rfl: 0 .  ramipril (ALTACE) 2.5 MG capsule, Take 2.5 mg by mouth daily., Disp: , Rfl:  .  Tamsulosin HCl (FLOMAX) 0.4 MG CAPS, Take 0.4 mg by mouth at bedtime., Disp: , Rfl:  .  metoprolol succinate (TOPROL-XL) 50 MG 24 hr tablet, Take 50 mg by mouth daily. Take with or immediately following a meal., Disp: , Rfl:  .  pravastatin (PRAVACHOL) 40 MG tablet, Take 40 mg by mouth daily., Disp: , Rfl:   PAST MEDICAL HISTORY: Past Medical History:  Diagnosis Date  . Anemia -baseline hemoglobin unknown 09/03/2012  . Arthritis   . Asthma   . COPD (chronic obstructive pulmonary disease) (HCC)   . Gout   . Hypercholesterolemia   . Hypertension   . Hyperuricemia   . Pulmonary embolism (HCC)    bilat following tendon repair  . Renal disorder   . Shortness of breath   . Tremor     PAST SURGICAL HISTORY: Past Surgical History:  Procedure Laterality Date  . achillis tendon    . CARDIAC CATHETERIZATION      FAMILY HISTORY: Family History  Problem Relation Age of Onset  . Pancreatic cancer Mother   . Cardiomyopathy Father   . Tremor Father     SOCIAL HISTORY:  Social History   Socioeconomic History  . Marital status: Single    Spouse name: Not on file  . Number of children: Not on file  . Years of education: Not on file  . Highest education level: Not on file  Social Needs  . Financial resource strain: Not on file  . Food insecurity - worry: Not on file  . Food insecurity - inability: Not on file  .  Transportation needs - medical: Not on file  . Transportation needs - non-medical: Not on file  Occupational History  . Not on file  Tobacco Use  . Smoking status: Former Games developer  . Smokeless tobacco: Never Used  Substance and Sexual Activity  . Alcohol use: Yes    Comment: occasional  . Drug use: No  . Sexual activity: Yes  Other Topics Concern  . Not on file  Social History Narrative  . Not on file     PHYSICAL EXAM  Vitals:   02/04/18 1059  BP: (!) 143/91  Pulse: 61  Resp: 14  Weight: 198 lb (89.8 kg)  Height: 6' (1.829 m)    Body mass index is 26.85 kg/m.   General: The patient is well-developed and well-nourished and in no acute distress  Eyes:  Funduscopic exam shows normal optic discs and retinal vessels.  Neck: The neck is supple, no carotid bruits are noted.  The neck is nontender.  Cardiovascular: The heart has a regular rate and rhythm with a normal S1 and S2. There were no murmurs, gallops or rubs. Lungs are clear to auscultation.  Skin: Extremities are without significant edema.  Musculoskeletal:  Back is nontender  Neurologic Exam  Mental status: The patient is alert and oriented x 3 at the time of the examination. The patient has apparent normal recent and remote memory, with an apparently normal attention span and concentration ability.   Speech is normal.  Cranial nerves: He has a left exophoria but individually range of motion of the extraocular muscles appeared normal.. Pupils are equal, round, and reactive to light and accomodation.  Visual fields are full.  Facial symmetry is present. There is good facial sensation to soft touch bilaterally.Facial strength is normal.  Trapezius and sternocleidomastoid strength is normal. No dysarthria is noted.  The tongue is midline, and the patient has symmetric elevation of the soft palate. No obvious hearing deficits are noted.  Motor:  Very mild tremor that comes out with sustained posturing and writing  Muscle bulk is normal.   Tone is normal. Strength is  5 / 5 in all 4 extremities.   Sensory: Sensory testing is intact to pinprick, soft touch and vibration sensation in all 4 extremities.  Coordination: Cerebellar testing reveals good finger-nose-finger and heel-to-shin bilaterally.  Gait and station: Station is normal.   Gait is normal. Tandem gait is normal. There is no retropulsion. He turns 180 in 2 steps. Romberg is negative.   Reflexes: Deep tendon reflexes are symmetric and normal bilaterally.   Plantar responses are flexor.    DIAGNOSTIC DATA (LABS, IMAGING, TESTING) - I reviewed patient records, labs, notes, testing and imaging myself where available.  Lab Results  Component Value Date   WBC 8.7 09/04/2012   HGB 11.4 (L) 09/04/2012   HCT 33.7 (L) 09/04/2012   MCV 80.0 09/04/2012   PLT 231 09/04/2012  Component Value Date/Time   NA 139 09/04/2012 0450   K 3.9 09/04/2012 0450   CL 105 09/04/2012 0450   CO2 23 09/04/2012 0450   GLUCOSE 95 09/04/2012 0450   BUN 14 09/04/2012 0450   CREATININE 1.42 (H) 09/04/2012 0450   CALCIUM 8.9 09/04/2012 0450   PROT 7.9 09/02/2012 1932   ALBUMIN 3.4 (L) 09/02/2012 1932   AST 24 09/02/2012 1932   ALT 17 09/02/2012 1932   ALKPHOS 120 (H) 09/02/2012 1932   BILITOT 0.7 09/02/2012 1932   GFRNONAA 53 (L) 09/04/2012 0450   GFRAA 61 (L) 09/04/2012 0450       ASSESSMENT AND PLAN  Benign essential tremor  In summary, Troy Gaines is a 67 year old man with a mild tremor. The characteristics of the tremor are most consistent with benign essential tremor. He does not have bradykinesia, rigidity or gait disturbance that would be more worrisome for Parkinson's. Currently, his symptoms are mild and they do not significantly interfere with his daily functioning. Therefore, I would not recommend starting medication at this time. He is already on a beta blocker and if the tremor worsens to affect his daily functioning, then I would  recommend beginning Mysoline. He will return to see me as needed if symptoms worsen or if new ones develop  AcuPressor me see Troy Gaines. Please let me know if I can be of further assistance with him or other patients in the future.   Dinesha Twiggs A. Epimenio FootSater, MD, [facility],FAAN 02/04/2018, 1:14 PM Certified in Neurology, Clinical Neurophysiology, Sleep Medicine, Pain Medicine and Neuroimaging  [facility] [address], [address] [place], [place] [address] [phone]

## 2018-02-24 ENCOUNTER — Encounter: Payer: Self-pay | Admitting: Cardiovascular Disease

## 2018-03-05 ENCOUNTER — Ambulatory Visit (INDEPENDENT_AMBULATORY_CARE_PROVIDER_SITE_OTHER): Payer: BLUE CROSS/BLUE SHIELD | Admitting: Cardiovascular Disease

## 2018-03-05 ENCOUNTER — Encounter: Payer: Self-pay | Admitting: *Deleted

## 2018-03-05 ENCOUNTER — Encounter: Payer: Self-pay | Admitting: Cardiovascular Disease

## 2018-03-05 VITALS — BP 135/78 | HR 69 | Ht 72.0 in | Wt 198.6 lb

## 2018-03-05 DIAGNOSIS — R0609 Other forms of dyspnea: Secondary | ICD-10-CM | POA: Diagnosis not present

## 2018-03-05 DIAGNOSIS — I10 Essential (primary) hypertension: Secondary | ICD-10-CM

## 2018-03-05 DIAGNOSIS — I824Y1 Acute embolism and thrombosis of unspecified deep veins of right proximal lower extremity: Secondary | ICD-10-CM

## 2018-03-05 DIAGNOSIS — R0789 Other chest pain: Secondary | ICD-10-CM | POA: Diagnosis not present

## 2018-03-05 DIAGNOSIS — I2699 Other pulmonary embolism without acute cor pulmonale: Secondary | ICD-10-CM | POA: Diagnosis not present

## 2018-03-05 DIAGNOSIS — Z7189 Other specified counseling: Secondary | ICD-10-CM

## 2018-03-05 DIAGNOSIS — Z9289 Personal history of other medical treatment: Secondary | ICD-10-CM

## 2018-03-05 NOTE — Progress Notes (Signed)
CARDIOLOGY CONSULT NOTE  Patient ID: Troy Gaines MRN: [id number] DOB/AGE: 07/19/1951 67 y.o.  Admit date: (Not on file) Primary Physician: Kela MillinBarrino, Alethea Y, MD Referring Physician: Dr. Margo Commonapper  Reason for Consultation: Shortness of breath  HPI: Troy Gaines is a 67 y.o. male who is being seen today for the evaluation of shortness of breath at the request of Louie Bostonapper, David B., MD.   Past medical history includes large bilateral pulmonary emboli including saddle emboli at the bifurcations in September 2013.  He is on chronic anticoagulation with Xarelto.  He also has hypertension.  ECG performed in the office today which I ordered and personally interpreted demonstrates normal sinus rhythm with no ischemic ST segment or T-wave abnormalities, nor any arrhythmias.  I reviewed records from his PCP.  He has been treated for sinusitis and a cough with amoxicillin and Tessalon Perles.  However, he continued to experience shortness of breath with exertion.  He apparently underwent an unremarkable coronary angiogram in September 2007.  He also apparently underwent a normal stress test in November 2014.  It appears he sustained a right leg DVT" small pulmonary embolism "last week and was [facility].  I will have to request these records.  He tells me that he had been on Xarelto for about 6 months after his initial diagnosis in September 2013 with bilateral PE and then it was stopped.  He is now back on Xarelto.  There is no history of clotting disorders within the family that he is aware of.  For the past 3 or 4 months, he is experience exertional dyspnea and chest tightness with activity such as walking on level ground and climbing stairs.  He had been walking 4 miles twice per week and riding on a recumbent bicycle.  He tries to stay active.  He has noticed that after exercising on a recumbent bicycle, his heart rate stays in the 140 bpm range and takes about 3  hours to come down to normal.  He has been undergoing a pheresis for the past 25 years.  His father developed a cardiomyopathy and has a pacemaker diagnosed at the age of 65.  He lives in [place].  Social history: The patient is originally from [place], [place].  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     No Known Allergies  Current Outpatient Medications  Medication Sig Dispense Refill  . albuterol (PROVENTIL HFA;VENTOLIN HFA) 108 (90 Base) MCG/ACT inhaler Inhale 2 puffs into the lungs daily.  0  . B Complex-Folic Acid (B COMPLEX-VITAMIN B12 PO) Take 1 tablet by mouth daily.     . metoprolol succinate (TOPROL-XL) 100 MG 24 hr tablet Take 100 mg by mouth daily. Take with or immediately following a meal.    . ramipril (ALTACE) 2.5 MG capsule Take 2.5 mg by mouth daily.    Carlena Hurl. XARELTO 15 MG TABS tablet Take 15 mg by mouth 2 (two) times daily with a meal.  0  . Tamsulosin HCl (FLOMAX) 0.4 MG CAPS Take 0.4 mg by mouth at bedtime.     No current facility-administered medications for this visit.     Past Medical History:  Diagnosis Date  . Anemia -baseline hemoglobin unknown 09/03/2012  . Arthritis   . Asthma   . COPD (chronic obstructive pulmonary disease) (HCC)   . Gout   . Hypercholesterolemia   . Hypertension   .  Hyperuricemia   . Pulmonary embolism (HCC)    bilat following tendon repair  . Renal disorder   . Shortness of breath   . Tremor     Past Surgical History:  Procedure Laterality Date  . achillis tendon    . CARDIAC CATHETERIZATION      Social History   Socioeconomic History  . Marital status: Single    Spouse name: Not on file  . Number of children: Not on file  . Years of education: Not on file  . Highest education level: Not on file  Social Needs  . Financial resource strain: Not on file  . Food insecurity - worry: Not on file  . Food insecurity - inability:  Not on file  . Transportation needs - medical: Not on file  . Transportation needs - non-medical: Not on file  Occupational History  . Not on file  Tobacco Use  . Smoking status: Former Smoker    Types: Cigars    Last attempt to quit: 08/18/1991    Years since quitting: 26.5  . Smokeless tobacco: Never Used  Substance and Sexual Activity  . Alcohol use: Yes    Comment: occasional  . Drug use: No  . Sexual activity: Yes  Other Topics Concern  . Not on file  Social History Narrative  . Not on file     No family history of premature CAD in 1st degree relatives.  Current Meds  Medication Sig  . albuterol (PROVENTIL HFA;VENTOLIN HFA) 108 (90 Base) MCG/ACT inhaler Inhale 2 puffs into the lungs daily.  . B Complex-Folic Acid (B COMPLEX-VITAMIN B12 PO) Take 1 tablet by mouth daily.   . metoprolol succinate (TOPROL-XL) 100 MG 24 hr tablet Take 100 mg by mouth daily. Take with or immediately following a meal.  . ramipril (ALTACE) 2.5 MG capsule Take 2.5 mg by mouth daily.  Carlena Hurl 15 MG TABS tablet Take 15 mg by mouth 2 (two) times daily with a meal.      Review of systems complete and found to be negative unless listed above in HPI    Physical exam Blood pressure 135/78, pulse 69, height 6' (1.829 m), weight 198 lb 9.6 oz (90.1 kg), SpO2 99 %. General: NAD Neck: No JVD, no thyromegaly or thyroid nodule.  Lungs: Clear to auscultation bilaterally with normal respiratory effort. CV: Nondisplaced PMI. Regular rate and rhythm, normal S1/S2, no S3/S4, no murmur.  No peripheral edema.  No carotid bruit.    Abdomen: Soft, nontender, no distention.  Skin: Intact without lesions or rashes.  Neurologic: Alert and oriented x 3.  Psych: Normal affect. Extremities: No clubbing or cyanosis.  HEENT: Normal.   ECG: Most recent ECG reviewed.   Labs: Lab Results  Component Value Date/Time   K 3.9 09/04/2012 04:50 AM   BUN 14 09/04/2012 04:50 AM   CREATININE 1.42 (H) 09/04/2012 04:50  AM   ALT 17 09/02/2012 07:32 PM   HGB 11.4 (L) 09/04/2012 04:50 AM     Lipids: No results found for: LDLCALC, LDLDIRECT, CHOL, TRIG, HDL      ASSESSMENT AND PLAN:  1.  Progressive exertional dyspnea and chest tightness: The symptoms have been ongoing for 3 or 4 months whereas he was diagnosed with a pulmonary embolism last week.  He may be expensing symptoms from his previous extensive bilateral pulmonary emboli with residual coronary disease.  However, an ischemic etiology will need to be ruled out.  He said he is claustrophobic and is uncertain  if he would be able to undergo a CT coronary scan which we discussed.  I also talked him about potentially undergoing an exercise Myoview stress test which she is in favor of.  I will first review hospital documentation and then decide on the best course of action.  2.  Hypertension: Presently controlled on ramipril and metoprolol succinate.  No changes to therapy.  3.  DVT and PE: While his initial event in 2013 occurred after Achilles tendon surgery, his most recent event appeared to be unprovoked.  I spoke to him at length about this and I would recommend lifelong anticoagulation.  He is currently on Xarelto 15 mg twice daily.     Disposition: Follow up in 6 weeks.   Signed: Prentice Docker, M.D., F.A.C.C.  03/05/2018, 10:23 AM

## 2018-03-05 NOTE — Patient Instructions (Addendum)
Your physician recommends that you schedule a follow-up appointment in: 6 WEEKS WITH DR KONESWARAN  Your physician recommends that you continue on your current medications as directed. Please refer to the Current Medication list given to you today.  Thank you for choosing [facility]!!    

## 2018-03-09 ENCOUNTER — Encounter: Payer: Self-pay | Admitting: *Deleted

## 2018-03-09 ENCOUNTER — Ambulatory Visit: Payer: Self-pay | Admitting: Cardiovascular Disease

## 2018-03-09 ENCOUNTER — Telehealth: Payer: Self-pay | Admitting: *Deleted

## 2018-03-09 DIAGNOSIS — Z01812 Encounter for preprocedural laboratory examination: Secondary | ICD-10-CM

## 2018-03-09 DIAGNOSIS — R0609 Other forms of dyspnea: Secondary | ICD-10-CM

## 2018-03-09 DIAGNOSIS — R0789 Other chest pain: Secondary | ICD-10-CM

## 2018-03-09 NOTE — Telephone Encounter (Signed)
Patient last seen on 03/05/2018 by Dr. Purvis SheffieldKoneswaran - [facility] - MD would like to order Coronary CT w/ FFR (dx:  Chest tightness, doe).    Patient notified & agrees to doing this test.  [facility]  Information sheet mailed to patient.

## 2018-03-19 ENCOUNTER — Telehealth: Payer: Self-pay | Admitting: Cardiovascular Disease

## 2018-03-19 NOTE — Telephone Encounter (Signed)
Patient called requesting to speak with D r. Koneswaran's nurse.

## 2018-03-22 NOTE — Telephone Encounter (Signed)
Patient notified.  He will double up on the 2.5mg  tab for now.  He will continue to log readings & bring with him to next OV which is scheduled for 04/20/2018 with Dr. Purvis SheffieldKoneswaran.

## 2018-03-22 NOTE — Telephone Encounter (Signed)
Patient calling with concerns about elevated blood pressure.  Stated he has noticed it consistently going back up.  Running 155-157/90-93 & HR 10-73.  Stated that his SOB is still there.

## 2018-03-22 NOTE — Telephone Encounter (Signed)
Increase ramipril to 5 mg daily.

## 2018-04-05 ENCOUNTER — Ambulatory Visit (HOSPITAL_COMMUNITY): Admission: RE | Admit: 2018-04-05 | Payer: BLUE CROSS/BLUE SHIELD | Source: Ambulatory Visit

## 2018-04-05 ENCOUNTER — Ambulatory Visit (HOSPITAL_COMMUNITY)
Admission: RE | Admit: 2018-04-05 | Discharge: 2018-04-05 | Disposition: A | Discharge status: Home / Self Care | Payer: BLUE CROSS/BLUE SHIELD | Source: Ambulatory Visit | Attending: Cardiovascular Disease | Admitting: Cardiovascular Disease

## 2018-04-05 DIAGNOSIS — R0789 Other chest pain: Secondary | ICD-10-CM | POA: Diagnosis not present

## 2018-04-05 DIAGNOSIS — I517 Cardiomegaly: Secondary | ICD-10-CM | POA: Diagnosis not present

## 2018-04-05 DIAGNOSIS — R0609 Other forms of dyspnea: Secondary | ICD-10-CM | POA: Diagnosis present

## 2018-04-05 DIAGNOSIS — R079 Chest pain, unspecified: Secondary | ICD-10-CM | POA: Diagnosis not present

## 2018-04-05 DIAGNOSIS — I313 Pericardial effusion (noninflammatory): Secondary | ICD-10-CM | POA: Insufficient documentation

## 2018-04-05 LAB — POCT I-STAT CREATININE: CREATININE: 1.3 mg/dL — AB (ref 0.61–1.24)

## 2018-04-05 MED ORDER — NITROGLYCERIN 0.4 MG SL SUBL
SUBLINGUAL_TABLET | SUBLINGUAL | Status: AC
  Administered 2018-04-05: 0.8 mg via SUBLINGUAL
  Filled 2018-04-05: qty 2

## 2018-04-05 MED ORDER — METOPROLOL TARTRATE 5 MG/5ML IV SOLN
INTRAVENOUS | Status: AC
  Administered 2018-04-05: 5 mg via INTRAVENOUS
  Filled 2018-04-05: qty 10

## 2018-04-05 MED ORDER — NITROGLYCERIN 0.4 MG SL SUBL
0.8000 mg | SUBLINGUAL_TABLET | Freq: Once | SUBLINGUAL | Status: AC
  Administered 2018-04-05: 0.8 mg via SUBLINGUAL

## 2018-04-05 MED ORDER — IOPAMIDOL (ISOVUE-370) INJECTION 76%
INTRAVENOUS | Status: AC
  Filled 2018-04-05: qty 100

## 2018-04-05 MED ORDER — METOPROLOL TARTRATE 5 MG/5ML IV SOLN
5.0000 mg | INTRAVENOUS | Status: DC | PRN
  Administered 2018-04-05 (×3): 5 mg via INTRAVENOUS

## 2018-04-05 MED ORDER — IOPAMIDOL (ISOVUE-370) INJECTION 76%
100.0000 mL | Freq: Once | INTRAVENOUS | Status: AC | PRN
  Administered 2018-04-05: 100 mL via INTRAVENOUS

## 2018-04-07 ENCOUNTER — Telehealth: Payer: Self-pay

## 2018-04-07 NOTE — Telephone Encounter (Signed)
-----   Message from Antoine PocheJonathan F Branch, MD sent at 04/07/2018  2:25 PM EDT ----- CT looks good. The arteries of his heart are completely clear without any evidence of blockages. His symptoms are not related to any form of blockage of his heart. F/u with Dr Kirtland BouchardK as planned  Dominga FerryJ Branch MD

## 2018-04-07 NOTE — Telephone Encounter (Signed)
Pt aware and voiced understanding - f/u with Dr Purvis SheffieldKoneswaran 4/23 - routed to pcp

## 2018-04-07 NOTE — Telephone Encounter (Signed)
Mr. Blondell RevealWinslow called requesting his recent test results.

## 2018-04-20 ENCOUNTER — Ambulatory Visit: Payer: BLUE CROSS/BLUE SHIELD | Admitting: Cardiovascular Disease

## 2018-04-29 ENCOUNTER — Ambulatory Visit (INDEPENDENT_AMBULATORY_CARE_PROVIDER_SITE_OTHER): Payer: BLUE CROSS/BLUE SHIELD | Admitting: Student

## 2018-04-29 ENCOUNTER — Encounter: Payer: Self-pay | Admitting: Student

## 2018-04-29 VITALS — BP 160/98 | HR 76 | Ht 72.0 in | Wt 199.4 lb

## 2018-04-29 DIAGNOSIS — I10 Essential (primary) hypertension: Secondary | ICD-10-CM

## 2018-04-29 DIAGNOSIS — I2699 Other pulmonary embolism without acute cor pulmonale: Secondary | ICD-10-CM

## 2018-04-29 DIAGNOSIS — R0609 Other forms of dyspnea: Secondary | ICD-10-CM | POA: Diagnosis not present

## 2018-04-29 DIAGNOSIS — Z79899 Other long term (current) drug therapy: Secondary | ICD-10-CM

## 2018-04-29 DIAGNOSIS — M10072 Idiopathic gout, left ankle and foot: Secondary | ICD-10-CM

## 2018-04-29 MED ORDER — RAMIPRIL 5 MG PO CAPS: 5.0000 mg | ORAL_CAPSULE | Freq: Every day | ORAL | 3 refills | Status: DC

## 2018-04-29 MED ORDER — COLCHICINE 0.6 MG PO TABS: 0.6000 mg | ORAL_TABLET | Freq: Every day | ORAL | 0 refills | Status: DC

## 2018-04-29 NOTE — Progress Notes (Signed)
Cardiology Office Note    Date:  04/29/2018   ID:  Troy Gaines, DOB 04-09-1951, MRN [id number]  PCP:  Kela Millin, MD  Cardiologist: Prentice Docker, MD    Chief Complaint  Patient presents with  . Follow-up    6 week visit    History of Present Illness:    Troy Gaines is a 67 y.o. male with past medical history of bilateral PE (on chronic anticoagulation with Xarelto), HTN, and HLD who presents to the office today for 6-week follow-up.  He was last evaluated by Dr. Purvis Sheffield on 03/05/2018 as a new patient referral for worsening shortness of breath. He reported having exertional chest discomfort with associated dyspnea when walking or climbing stairs. He had previously been walking 4 miles at a time without any symptoms. It was unclear if symptoms were due to a recently diagnosed PE or possible ischemic etiology, therefore a Coronary CT was recommended for further evaluation.   This was performed on 04/05/2018 and showed a coronary calcium score of 0, suggesting low-risk for future cardiac events. Over-read showed mild cardiomegaly with a small pericardial effusion and bibasilar scarring or atelectasis with no acute cardiac abnormalities.   In talking with the patient today, he reports having continual dyspnea on exertion but has noticed some improvement in his symptoms since his last office visit. Denies any associated chest pain, palpitations, orthopnea, PND, or lower extremity edema. Has been trying to start exercising more regularly as he was doing prior to his PE.   He has been following BP at home and says this remains elevated. He has not yet titrated Ramipril to  daily as previously instructed.   His main concern today is that he is experiencing recurrent pain along his left great toe. Has a history of gout and would previously take Indomethacin with improvement in his symptoms but has been trying to avoid using NSAIDS while on anticoagulation.    Past  Medical History:  Diagnosis Date  . Anemia -baseline hemoglobin unknown 09/03/2012  . Arthritis   . Asthma   . COPD (chronic obstructive pulmonary disease) (HCC)   . Gout   . Hypercholesterolemia   . Hypertension   . Hyperuricemia   . Pulmonary embolism (HCC)    bilat following tendon repair  . Renal disorder   . Shortness of breath   . Tremor     Past Surgical History:  Procedure Laterality Date  . achillis tendon    . CARDIAC CATHETERIZATION      Current Medications: Outpatient Medications Prior to Visit  Medication Sig Dispense Refill  . albuterol (PROVENTIL HFA;VENTOLIN HFA) 108 (90 Base) MCG/ACT inhaler Inhale 2 puffs into the lungs daily.  0  . B Complex-Folic Acid (B COMPLEX-VITAMIN B12 PO) Take 1 tablet by mouth daily.     . metoprolol succinate (TOPROL-XL) 100 MG 24 hr tablet Take 100 mg by mouth daily. Take with or immediately following a meal.    . Tamsulosin HCl (FLOMAX) 0.4 MG CAPS Take 0.4 mg by mouth at bedtime.    Carlena Hurl 20 MG TABS tablet Take 1 tablet by mouth daily.  1  . indomethacin (INDOCIN) 50 MG capsule Take 1 capsule by mouth 3 (three) times daily.  5  . ramipril (ALTACE) 2.5 MG capsule Take 5 mg by mouth daily.    Carlena Hurl 15 MG TABS tablet Take 15 mg by mouth 2 (two) times daily with a meal.  0   No facility-administered medications prior  to visit.      Allergies:   Patient has no known allergies.   Social History   Socioeconomic History  . Marital status: Single    Spouse name: Not on file  . Number of children: Not on file  . Years of education: Not on file  . Highest education level: Not on file  Occupational History  . Not on file  Social Needs  . Financial resource strain: Not on file  . Food insecurity:    Worry: Not on file    Inability: Not on file  . Transportation needs:    Medical: Not on file    Non-medical: Not on file  Tobacco Use  . Smoking status: Former Smoker    Types: Cigars    Last attempt to quit: 08/18/1991      Years since quitting: 26.7  . Smokeless tobacco: Never Used  Substance and Sexual Activity  . Alcohol use: Yes    Comment: occasional  . Drug use: No  . Sexual activity: Yes  Lifestyle  . Physical activity:    Days per week: Not on file    Minutes per session: Not on file  . Stress: Not on file  Relationships  . Social connections:    Talks on phone: Not on file    Gets together: Not on file    Attends religious service: Not on file    Active member of club or organization: Not on file    Attends meetings of clubs or organizations: Not on file    Relationship status: Not on file  Other Topics Concern  . Not on file  Social History Narrative  . Not on file     Family History:  The patient's family history includes Cardiomyopathy in his father; Pancreatic cancer in his mother; Tremor in his father.   Review of Systems:   Please see the history of present illness.     General:  No chills, fever, night sweats or weight changes. Positive for pain along left great toe.  Cardiovascular:  No chest pain, edema, orthopnea, palpitations, paroxysmal nocturnal dyspnea. Positive for dyspnea on exertion.  Dermatological: No rash, lesions/masses Respiratory: No cough, dyspnea Urologic: No hematuria, dysuria Abdominal:   No nausea, vomiting, diarrhea, bright red blood per rectum, melena, or hematemesis Neurologic:  No visual changes, wkns, changes in mental status. All other systems reviewed and are otherwise negative except as noted above.   Physical Exam:    VS:  BP (!) 160/98   Pulse 76   Ht 6' (1.829 m)   Wt 199 lb 6.4 oz (90.4 kg)   SpO2 96% Comment: on room air  BMI 27.04 kg/m    General: Well developed, well nourished [place] male appearing in no acute distress. Head: Normocephalic, atraumatic, sclera non-icteric, no xanthomas, nares are without discharge.  Neck: No carotid bruits. JVD not elevated.  Lungs: Respirations regular and unlabored, without wheezes or  rales.  Heart: Regular rate and rhythm. No S3 or S4.  No murmur, no rubs, or gallops appreciated. Abdomen: Soft, non-tender, non-distended with normoactive bowel sounds. No hepatomegaly. No rebound/guarding. No obvious abdominal masses. Msk:  Strength and tone appear normal for age. No joint deformities or effusions. Extremities: No clubbing or cyanosis. No lower extremity edema.  Distal pedal pulses are 2+ bilaterally. Swelling along left hallux which is tender to palpation. Mild erythema.  Neuro: Alert and oriented X 3. Moves all extremities spontaneously. No focal deficits noted. Psych:  Responds to questions  appropriately with a normal affect. Skin: No rashes or lesions noted  Wt Readings from Last 3 Encounters:  04/29/18 199 lb 6.4 oz (90.4 kg)  03/05/18 198 lb 9.6 oz (90.1 kg)  02/04/18 198 lb (89.8 kg)     Studies/Labs Reviewed:   EKG:  EKG is not ordered today.   Recent Labs: 04/05/2018: Creatinine, Ser 1.30   Lipid Panel No results found for: CHOL, TRIG, HDL, CHOLHDL, VLDL, LDLCALC, LDLDIRECT  Additional studies/ records that were reviewed today include:   Coronary CT: 04/05/2018  IMPRESSION: Mild cardiomegaly.  Small pericardial effusion.  Bibasilar scarring or atelectasis.  No acute extra cardiac abnormality.  FINDINGS: Non-cardiac: See separate report from [facility].  Calcium Score: 0 Agatston units.  Coronary Arteries: Right dominant with no anomalies  LM: No plaque or stenosis.  LAD system: Medium-sized D1. No plaque or stenosis in the LAD system.  Circumflex system: Ramus 1 medium-sized vessel, no significant disease. Ramus 2 small vessel, no significant disease. No plaque or stenosis in the LCx system.  RCA system: No plaque or stenosis.  IMPRESSION: 1. Coronary artery calcium score 0 Agatston units, suggesting low risk for future cardiac events.  2.  No significant disease noted in the coronaries.   Assessment:    1.  Dyspnea on exertion   2. Recurrent pulmonary embolism (HCC)   3. Essential hypertension   4. Acute idiopathic gout involving toe of left foot   5. Medication management      Plan:   In order of problems listed above:  1. Dyspnea on Exertion - unclear etiology as this was thought to be secondary to his recent PE. A Coronary CT was performed for further evaluation and showed a calcium score of zero which was reviewed in detail with the patient today.  - he is gradually increasing his activity which should continue to help with his symptoms as deconditioning is likely playing a role as well. I offered a referral to Pulmonology for further evaluation but he wishes to hold off on this for now. Will follow-up with his PCP in 05/2018 and if symptoms have not improved, consider Pulmonology referral at that time.   2. Recurrent PE - previously diagnosed following surgery but most recent event in 02/2018 was unprovoked. Will be on lifelong anticoagulation.  - he denies any evidence of active bleeding. Continue Xarelto for anticoagulation.   3. HTN - BP remains elevated at 160/98 during today's visit. Was previously instructed to increase Ramipril but has not yet made this adjustment. Recommended he increase to  daily and continue to follow BP with this adjustment. Will need a repeat BMET in 2 weeks. Continue Toprol-XL  daily.   4. Gout - has a history of gout and reports recurrent pain along his left great toe. This is swollen and erythematous on examination. He has avoided NSAIDS due to being on anticoagulation. Will provide with Rx for Colchicine 0.6mg  BID. If unable to tolerate this (had diarrhea several years ago), can consider a steroid taper.   Signed, Ellsworth Lennox, PA-C  04/29/2018 5:06 PM    [facility] [address] [place], [place] [address] Phone: [phone]

## 2018-04-29 NOTE — Patient Instructions (Signed)
Medication Instructions:  Your physician has recommended you make the following change in your medication:  Start Ramipril 5 mg Daily  Start Colchicine 0.6 mg Daily  Stop Indocin    Labwork: Your physician recommends that you return for lab work in: 2 Weeks    Testing/Procedures: NONE   Follow-Up: Your physician wants you to follow-up in: 1 Year.  You will receive a reminder letter in the mail two months in advance. If you don't receive a letter, please call our office to schedule the follow-up appointment.   Any Other Special Instructions Will Be Listed Below (If Applicable).     If you need a refill on your cardiac medications before your next appointment, please call your pharmacy.

## 2018-05-21 IMAGING — CT CT HEART MORP W/ CTA COR W/ SCORE W/ CA W/CM &/OR W/O CM
4 of 7 series · 8 of 20 positions shown, 9 images · non-contrast
Comparison: 02/24/2018

CLINICAL DATA: Chest pain

EXAM:
Cardiac CTA
MEDICATIONS:
Sub lingual nitro.  4mg x 2 and lopressor 5mg IV
TECHNIQUE: The patient was scanned on a Siemens [REDACTED]ice scanner. Gantry
rotation speed was 250 msecs. Collimation was .6 mm. A 100 kV
prospective scan was triggered in the ascending thoracic aorta at
35-75% of the R-R interval. Average HR during the scan was 60 bpm.
The 3D data set was interpreted on a dedicated work station using
MPR, MIP and VRT modes. A total of 80cc of contrast was used.

[Series 6: best diast 71 % · axial · 0.33mm/px · z∈[+1160,+1209]mm · 2 of 368 slices shown, 3 images]
[im 123/368  vessel]
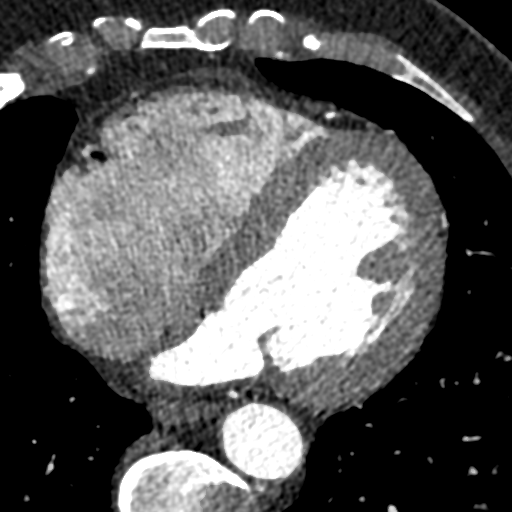
[im 123/368  lung]
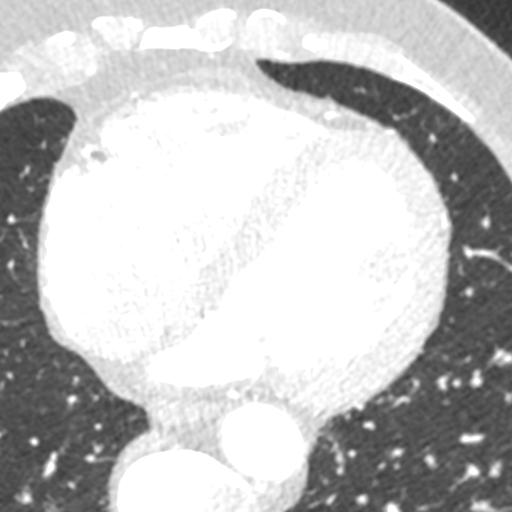
[im 245/368  vessel]
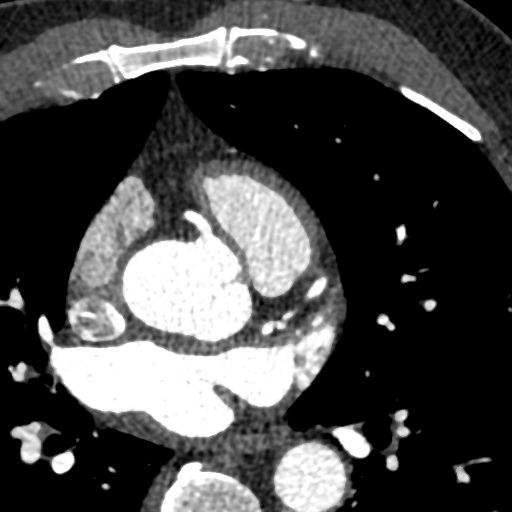

[Series 7: best syst 41 % · axial · 0.33mm/px · z∈[+1160,+1209]mm · 2 of 368 slices shown]
[im 123/368  vessel]
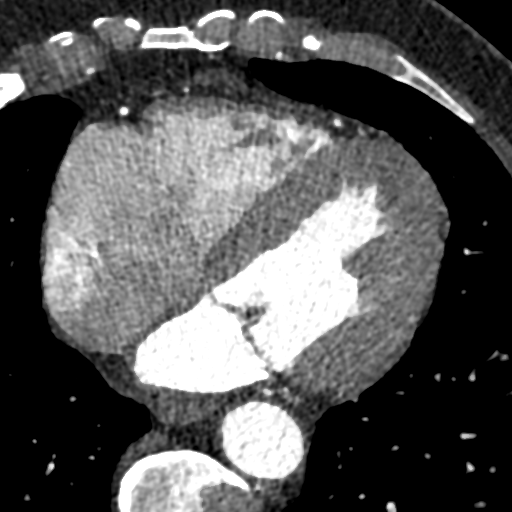
[im 245/368  vessel]
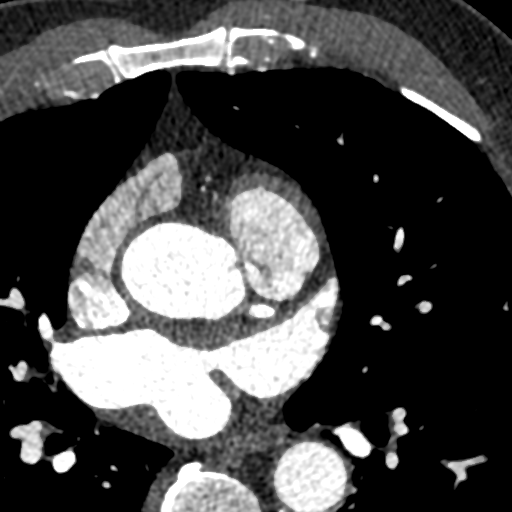

[Series 8: ts diast sharp 71 % · axial · 0.33mm/px · z∈[+1160,+1209]mm · 2 of 368 slices shown]
[im 123/368  lung]
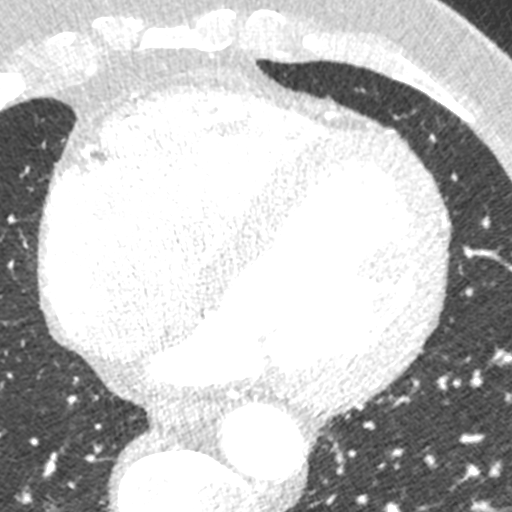
[im 245/368  lung]
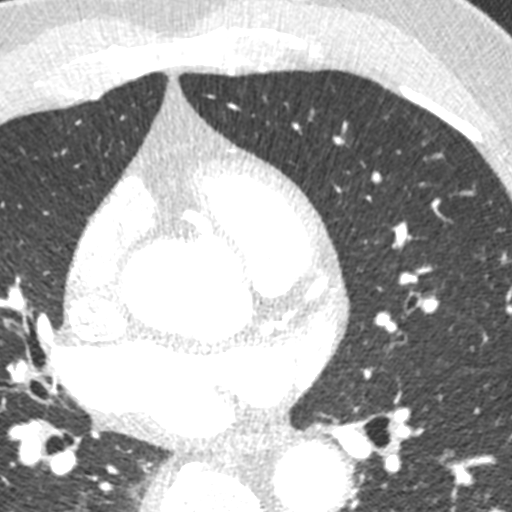

[Series 9: ts syst sharp 41 % · axial · 0.33mm/px · z∈[+1160,+1209]mm · 2 of 368 slices shown]
[im 123/368  lung]
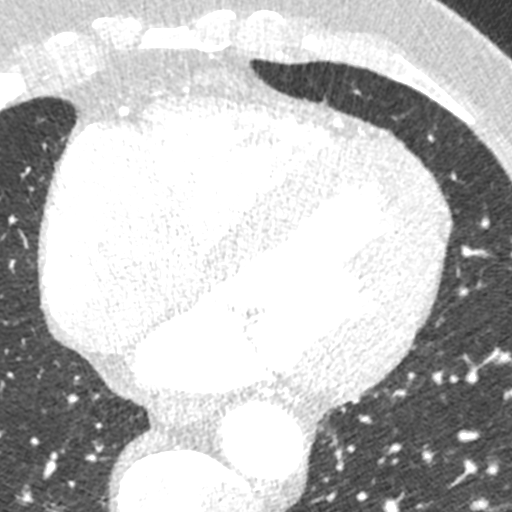
[im 245/368  lung]
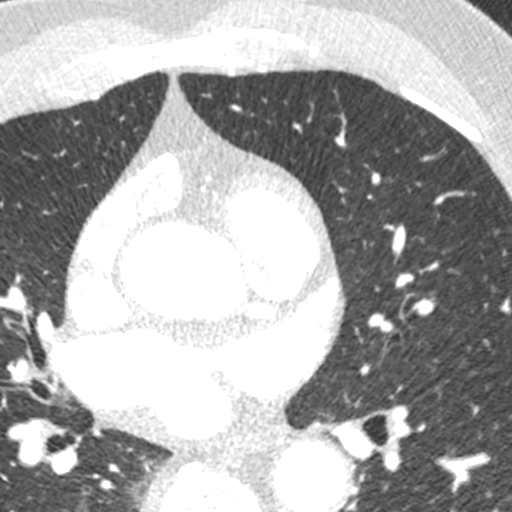

[8 of 20 positions shown; findings below may reference images not displayed]

FINDINGS: Non-cardiac: See separate report from [REDACTED].

Calcium Score: 0 Agatston units.

Coronary Arteries: Right dominant with no anomalies

LM: No plaque or stenosis.

LAD system: Medium-sized D1. No plaque or stenosis in the LAD
system.

Circumflex system: Ramus 1 medium-sized vessel, no significant
disease. Ramus 2 small vessel, no significant disease. No plaque or
stenosis in the LCx system.

RCA system: No plaque or stenosis.
IMPRESSION: 1. Coronary artery calcium score 0 Agatston units, suggesting low
risk for future cardiac events.

2.  No significant disease noted in the coronaries.

Flocosu Gadrautan

EXAM:
OVER-READ INTERPRETATION  CT CHEST

The following report is an over-read performed by radiologist Dr.
Rasani Eksteen [REDACTED] on 04/05/2018. This over-read
does not include interpretation of cardiac or coronary anatomy or
pathology. The coronary CTA interpretation by the cardiologist is
attached.
FINDINGS: Vascular: Previously seen small posterior left lower lobe pulmonary
embolus not definitively visualized on today's study. Heart is
mildly enlarged. Small pericardial effusion. Visualized aorta is
normal caliber.

Mediastinum/Nodes: No adenopathy in the lower mediastinum or hila.
Small hiatal hernia.

Lungs/Pleura: Linear atelectasis or scarring in the lung bases. No
effusions.

Upper Abdomen: Imaging into the upper abdomen shows no acute
findings.

Musculoskeletal: Chest wall soft tissues are unremarkable. No acute
bony abnormality.
IMPRESSION: Mild cardiomegaly.  Small pericardial effusion.

Bibasilar scarring or atelectasis.

No acute extra cardiac abnormality.

## 2019-04-29 ENCOUNTER — Ambulatory Visit: Payer: BLUE CROSS/BLUE SHIELD | Admitting: Cardiovascular Disease

## 2019-05-03 ENCOUNTER — Telehealth: Payer: Self-pay | Admitting: *Deleted

## 2019-05-03 NOTE — Telephone Encounter (Signed)
   Primary Cardiologist:  Prentice Docker, MD   Patient contacted.  History reviewed.  No symptoms to suggest any unstable cardiac conditions.  Based on discussion, with current pandemic situation, we will be postponing this appointment for Troy Gaines with a plan for f/u in July 2020 or sooner if feasible/necessary.  If symptoms change, he has been instructed to contact our office.    Thalia Bloodgood, RN  05/03/2019 4:41 PM         .

## 2019-05-06 ENCOUNTER — Ambulatory Visit: Payer: BLUE CROSS/BLUE SHIELD | Admitting: Cardiovascular Disease

## 2019-06-01 ENCOUNTER — Other Ambulatory Visit: Payer: Self-pay | Admitting: *Deleted

## 2019-06-01 MED ORDER — RAMIPRIL 5 MG PO CAPS: 5.0000 mg | ORAL_CAPSULE | Freq: Every day | ORAL | 3 refills | Status: AC

## 2019-07-22 ENCOUNTER — Telehealth: Payer: Self-pay | Admitting: Cardiovascular Disease

## 2019-07-22 NOTE — Telephone Encounter (Signed)
Virtual Visit Pre-Appointment Phone Call  "(Name), I am calling you today to discuss your upcoming appointment. We are currently trying to limit exposure to the virus that causes COVID-19 by seeing patients at home rather than in the office."  1. "What is the BEST phone number to call the day of the visit?" - include this in appointment notes  2. Do you have or have access to (through a family member/friend) a smartphone with video capability that we can use for your visit?" a. If yes - list this number in appt notes as cell (if different from BEST phone #) and list the appointment type as a VIDEO visit in appointment notes b. If no - list the appointment type as a PHONE visit in appointment notes  3. Confirm consent - "In the setting of the current Covid19 crisis, you are scheduled for a (phone or video) visit with your provider on (date) at (time).  Just as we do with many in-office visits, in order for you to participate in this visit, we must obtain consent.  If you'd like, I can send this to your mychart (if signed up) or email for you to review.  Otherwise, I can obtain your verbal consent now.  All virtual visits are billed to your insurance company just like a normal visit would be.  By agreeing to a virtual visit, we'd like you to understand that the technology does not allow for your provider to perform an examination, and thus may limit your provider's ability to fully assess your condition. If your provider identifies any concerns that need to be evaluated in person, we will make arrangements to do so.  Finally, though the technology is pretty good, we cannot assure that it will always work on either your or our end, and in the setting of a video visit, we may have to convert it to a phone-only visit.  In either situation, we cannot ensure that we have a secure connection.  Are you willing to proceed?" STAFF: Did the patient verbally acknowledge consent to telehealth visit? Document  YES/NO here: yes  4. Advise patient to be prepared - "Two hours prior to your appointment, go ahead and check your blood pressure, pulse, oxygen saturation, and your weight (if you have the equipment to check those) and write them all down. When your visit starts, your provider will ask you for this information. If you have an Apple Watch or Kardia device, please plan to have heart rate information ready on the day of your appointment. Please have a pen and paper handy nearby the day of the visit as well."  5. Give patient instructions for MyChart download to smartphone OR Doximity/Doxy.me as below if video visit (depending on what platform provider is using)  6. Inform patient they will receive a phone call 15 minutes prior to their appointment time (may be from unknown caller ID) so they should be prepared to answer    [facility] has been deemed a candidate for a follow-up tele-health visit to limit community exposure during the Covid-19 pandemic. I spoke with the patient via phone to ensure availability of phone/video source, confirm preferred email & phone number, and discuss instructions and expectations.  I reminded Troy Gaines to be prepared with any vital sign and/or heart rhythm information that could potentially be obtained via home monitoring, at the time of his visit. I reminded Troy Gaines to expect a phone call prior to  his visit.  Troy ContrasStephanie R Gaines 07/22/2019 10:19 AM   INSTRUCTIONS FOR DOWNLOADING THE MYCHART APP TO SMARTPHONE  - The patient must first make sure to have activated MyChart and know their login information - If Apple, go to [employer] Store and type in MyChart in the search bar and download the app. If Android, ask patient to go to [employer] Play Store and type in [place] in the search bar and download the app. The app is free but as with any other app downloads, their phone may require them to verify saved payment information or  Apple/Android password.  - The patient will need to then log into the app with their MyChart username and password, and select [facility] as their healthcare provider to link the account. When it is time for your visit, go to the MyChart app, find appointments, and click Begin Video Visit. Be sure to Select Allow for your device to access the Microphone and Camera for your visit. You will then be connected, and your provider will be with you shortly.  **If they have any issues connecting, or need assistance please contact MyChart service desk [phone])**  **If using a computer, in order to ensure the best quality for their visit they will need to use either of the following Internet Browsers: [employer] Edge, or Google Chrome**  IF USING DOXIMITY or DOXY.ME - The patient will receive a link just prior to their visit by text.     FULL LENGTH CONSENT FOR TELE-HEALTH VISIT   I hereby voluntarily request, consent and authorize [facility] and its employed or contracted physicians, physician assistants, nurse practitioners or other licensed health care professionals (the Practitioner), to provide me with telemedicine health care services (the Services") as deemed necessary by the treating Practitioner. I acknowledge and consent to receive the Services by the Practitioner via telemedicine. I understand that the telemedicine visit will involve communicating with the Practitioner through live audiovisual communication technology and the disclosure of certain medical information by electronic transmission. I acknowledge that I have been given the opportunity to request an in-person assessment or other available alternative prior to the telemedicine visit and am voluntarily participating in the telemedicine visit.  I understand that I have the right to withhold or withdraw my consent to the use of telemedicine in the course of my care at any time, without affecting my right to future care  or treatment, and that the Practitioner or I may terminate the telemedicine visit at any time. I understand that I have the right to inspect all information obtained and/or recorded in the course of the telemedicine visit and may receive copies of available information for a reasonable fee.  I understand that some of the potential risks of receiving the Services via telemedicine include:   Delay or interruption in medical evaluation due to technological equipment failure or disruption;  Information transmitted may not be sufficient (e.g. poor resolution of images) to allow for appropriate medical decision making by the Practitioner; and/or   In rare instances, security protocols could fail, causing a breach of personal health information.  Furthermore, I acknowledge that it is my responsibility to provide information about my medical history, conditions and care that is complete and accurate to the best of my ability. I acknowledge that Practitioner's advice, recommendations, and/or decision may be based on factors not within their control, such as incomplete or inaccurate data provided by me or distortions of diagnostic images or specimens that may result from electronic transmissions. I  understand that the practice of medicine is not an exact science and that Practitioner makes no warranties or guarantees regarding treatment outcomes. I acknowledge that I will receive a copy of this consent concurrently upon execution via email to the email address I last provided but may also request a printed copy by calling the office of [facility].    I understand that my insurance will be billed for this visit.   I have read or had this consent read to me.  I understand the contents of this consent, which adequately explains the benefits and risks of the Services being provided via telemedicine.   I have been provided ample opportunity to ask questions regarding this consent and the Services and have had  my questions answered to my satisfaction.  I give my informed consent for the services to be provided through the use of telemedicine in my medical care  By participating in this telemedicine visit I agree to the above.

## 2019-07-27 ENCOUNTER — Telehealth (INDEPENDENT_AMBULATORY_CARE_PROVIDER_SITE_OTHER): Payer: Medicare HMO | Admitting: Cardiovascular Disease

## 2019-07-27 ENCOUNTER — Encounter: Payer: Self-pay | Admitting: Cardiovascular Disease

## 2019-07-27 VITALS — BP 135/80 | Ht 72.0 in | Wt 192.0 lb

## 2019-07-27 DIAGNOSIS — I10 Essential (primary) hypertension: Secondary | ICD-10-CM

## 2019-07-27 DIAGNOSIS — I2699 Other pulmonary embolism without acute cor pulmonale: Secondary | ICD-10-CM

## 2019-07-27 DIAGNOSIS — R0609 Other forms of dyspnea: Secondary | ICD-10-CM

## 2019-07-27 NOTE — Progress Notes (Signed)
Virtual Visit via Video Note   This visit type was conducted due to national recommendations for restrictions regarding the COVID-19 Pandemic (e.g. social distancing) in an effort to limit this patient's exposure and mitigate transmission in our community.  Due to his co-morbid illnesses, this patient is at least at moderate risk for complications without adequate follow up.  This format is felt to be most appropriate for this patient at this time.  All issues noted in this document were discussed and addressed.  A limited physical exam was performed with this format.  Please refer to the patient's chart for his consent to telehealth for [facility].   Date:  07/27/2019   ID:  Troy Gaines, DOB 1951-10-26, MRN [id number]  Patient Location: Home Provider Location: Office  PCP:  Kela MillinBarrino, Alethea Y, MD  Cardiologist:  Prentice DockerSuresh Jesper Stirewalt, MD  Electrophysiologist:  None   Evaluation Performed:  Follow-Up Visit  Chief Complaint:  PE  History of Present Illness:    Troy Gaines is a 68 y.o. 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  He is doing well and denies exertional chest pain.  He has some mild episodic shortness of breath and says he has not been exercising and attributes this to deconditioning.  He has had occasional "chest twinges "at rest but says this is not been anything concerning to him.  He is thinking about going back to work but also enjoys the free time he has and would like to go visit his grandchildren who live in [place], and [place], [place].  He plans to begin using his recumbent bicycle.  He does go for walks.  The patient does not have symptoms concerning for COVID-19 infection (fever, chills, cough, or new shortness of breath).   Social history: The patient is originally from [place], [place].  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   Past Medical History:  Diagnosis Date  . Anemia -baseline hemoglobin unknown 09/03/2012  . Arthritis   . Asthma   . COPD (chronic obstructive pulmonary disease) (HCC)   . Gout   . Hypercholesterolemia   . Hypertension   . Hyperuricemia   . Pulmonary embolism (HCC)    bilat following tendon repair  . Renal disorder   . Shortness of breath   . Tremor    Past Surgical History:  Procedure Laterality Date  . achillis tendon    . CARDIAC CATHETERIZATION       Current Meds  Medication Sig  . albuterol (PROVENTIL HFA;VENTOLIN HFA) 108 (90 Base) MCG/ACT inhaler Inhale 2 puffs into the lungs daily.  . B Complex-Folic Acid (B COMPLEX-VITAMIN B12 PO) Take 1 tablet by mouth daily.   . metoprolol succinate (TOPROL-XL) 100 MG 24 hr tablet Take 100 mg by mouth daily. Take with or immediately following a meal.  . ramipril (ALTACE) 5 MG capsule Take 1 capsule (5 mg total) by mouth daily.  . Tamsulosin HCl (FLOMAX) 0.4 MG CAPS Take 0.4 mg by mouth at bedtime.  Carlena Hurl. XARELTO 20 MG TABS tablet Take 1 tablet by mouth daily.  . [DISCONTINUED] colchicine 0.6 MG tablet Take 1 tablet (0.6 mg total) by mouth daily.     Allergies:   Patient has no known allergies.   Social History   Tobacco Use  . Smoking status: Former Smoker    Types:  Cigars    Quit date: 08/18/1991    Years since quitting: 27.9  . Smokeless tobacco: Never Used  Substance Use Topics  . Alcohol use: Yes    Comment: occasional  . Drug use: No     Family Hx: The patient's family history includes Cardiomyopathy in his father; Pancreatic cancer in his mother; Tremor in his father.  ROS:   Please see the history of present illness.     All other systems reviewed and are negative.   Prior CV studies:   The following studies were reviewed today:  Coronary CT: 04/05/2018  IMPRESSION: Mild cardiomegaly. Small pericardial effusion.  Bibasilar scarring or  atelectasis.  No acute extra cardiac abnormality.  FINDINGS: Non-cardiac: See separate report from [facility].  Calcium Score: 0 Agatston units.  Coronary Arteries: Right dominant with no anomalies  LM: No plaque or stenosis.  LAD system: Medium-sized D1. No plaque or stenosis in the LAD system.  Circumflex system: Ramus 1 medium-sized vessel, no significant disease. Ramus 2 small vessel, no significant disease. No plaque or stenosis in the LCx system.  RCA system: No plaque or stenosis.  IMPRESSION: 1. Coronary artery calcium score 0 Agatston units, suggesting low risk for future cardiac events.  2. No significant disease noted in the coronaries.  Labs/Other Tests and Data Reviewed:    EKG:  No ECG reviewed.  Recent Labs: No results found for requested labs within last 8760 hours.   Recent Lipid Panel No results found for: CHOL, TRIG, HDL, CHOLHDL, LDLCALC, LDLDIRECT  Wt Readings from Last 3 Encounters:  07/27/19 192 lb (87.1 kg)  04/29/18 199 lb 6.4 oz (90.4 kg)  03/05/18 198 lb 9.6 oz (90.1 kg)     Objective:    Vital Signs:  BP 135/80   Ht 6' (1.829 m)   Wt 192 lb (87.1 kg)   BMI 26.04 kg/m    VITAL SIGNS:  reviewed   Gen: NAD. HEENT: EOMI. Neck: No JVD. Resp: Unlabored. Musc: No abnormalities. Neuro: Non-focal. A+Ox3. Psych: Normal affect.  ASSESSMENT & PLAN:    1.  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  Symptomatically stable.  2.  Hypertension: Blood pressure is normal.  No changes to therapy.  3.  Exertional dyspnea: Likely due to cardiopulmonary deconditioning.  He plans to begin using his recumbent bicycle.   COVID-19 Education: The signs and symptoms of COVID-19 were discussed with the patient and how to seek care for testing (follow up with PCP or arrange E-visit).  The importance of social distancing was discussed today.  Time:   Today, I have spent 10 minutes with the  patient with telehealth technology discussing the above problems.     Medication Adjustments/Labs and Tests Ordered: Current medicines are reviewed at length with the patient today.  Concerns regarding medicines are outlined above.   Tests Ordered: No orders of the defined types were placed in this encounter.   Medication Changes: No orders of the defined types were placed in this encounter.   Follow Up:  Virtual Visit or In Person prn  Signed, Kate Sable, MD  07/27/2019 11:20 AM    [place]

## 2019-07-27 NOTE — Patient Instructions (Addendum)
Your physician recommends that you schedule a follow-up appointment in: AS NEEDED WITH DR KONESWARAN  Your physician recommends that you continue on your current medications as directed. Please refer to the Current Medication list given to you today.  Thank you for choosing [facility]!!    

## 2019-11-28 ENCOUNTER — Ambulatory Visit (INDEPENDENT_AMBULATORY_CARE_PROVIDER_SITE_OTHER): Payer: Medicare HMO | Admitting: Otolaryngology

## 2019-11-28 DIAGNOSIS — K219 Gastro-esophageal reflux disease without esophagitis: Secondary | ICD-10-CM

## 2019-11-28 DIAGNOSIS — R490 Dysphonia: Secondary | ICD-10-CM | POA: Diagnosis not present

## 2020-03-07 ENCOUNTER — Encounter: Payer: Self-pay | Admitting: Orthopaedic Surgery

## 2020-03-07 ENCOUNTER — Ambulatory Visit: Payer: Medicare HMO | Admitting: Orthopaedic Surgery

## 2020-03-07 ENCOUNTER — Other Ambulatory Visit: Payer: Self-pay

## 2020-03-07 ENCOUNTER — Ambulatory Visit (INDEPENDENT_AMBULATORY_CARE_PROVIDER_SITE_OTHER): Payer: Medicare HMO

## 2020-03-07 DIAGNOSIS — M25511 Pain in right shoulder: Secondary | ICD-10-CM

## 2020-03-07 DIAGNOSIS — G8929 Other chronic pain: Secondary | ICD-10-CM | POA: Diagnosis not present

## 2020-03-07 DIAGNOSIS — M160 Bilateral primary osteoarthritis of hip: Secondary | ICD-10-CM

## 2020-03-07 NOTE — Progress Notes (Signed)
Office Visit Note   Patient: Troy Gaines           Date of Birth: 1951-09-12           MRN: [id number] Visit Date: 03/07/2020              Requested by: Leeanne Rio, MD [place],  [place] [address] PCP: Leeanne Rio, MD   Assessment & Plan: Visit Diagnoses:  1. Bilateral primary osteoarthritis of hip   2. Chronic right shoulder pain     Plan: Impression is moderate severe bilateral hip osteoarthritis and right shoulder subacromial bursitis.  In regards to both hips, he does not feel he is symptomatic enough to proceed with cortisone injections.  If his symptoms continue to worsen he will call us for referral to Dr. Junius Roads for injection.  In regards to his shoulder, he is not having significant enough pain for injection there either.  I have provided him with a [employer] exercise program.  He will follow-up with [place] as needed.  Call with concerns or questions.  Follow-Up Instructions: Return if symptoms worsen or fail to improve.   Orders:  Orders Placed This Encounter  Procedures   XR HIPS BILAT W OR W/O PELVIS 3-4 VIEWS   XR Shoulder Right   No orders of the defined types were placed in this encounter.     Procedures: No procedures performed   Clinical Data: No additional findings.   Subjective: Chief Complaint  Patient presents with   Right Hip - Pain   Left Hip - Pain   Right Shoulder - Pain    HPI patient is a pleasant 69 year old gentleman who comes in today with bilateral hip pain left greater than right.  This has been ongoing for the past 4 months.  No known injury or change in activity.  The pain he has is primarily to the groin and occasionally to the anterior thigh.  The pain is aggravated with extensive walking or following extensive walking.  He has tried cherry juice, indomethacin, heat and rest with moderate relief of symptoms.  He does note occasional tingling and burning to the very proximal anterior thigh.  No previous  cortisone injections to either hip.  He does have a history of intermittent lower back pain for the past 20 years.  He has never had any radiculopathy of either lower extremity.  No previous ESI's.  Other issue he brings up today is occasional right shoulder pain for the past 6 months.  No specific injury but he does note that he did a lot of weightlifting prior to the onset of Covid.  He has since stopped lifting weights without significant relief of pain.  The pain he has is to the anterior shoulder.  No weakness.  The pain is aggravated with internal rotation.  No previous injection or surgical intervention.  Review of Systems as detailed in HPI.  All others reviewed and are negative.   Objective: Vital Signs: There were no vitals taken for this visit.  Physical Exam well-developed well-nourished gentleman no acute distress.  Alert and oriented x3.  Ortho Exam examination of both hips reveals a minimally positive logroll left greater than right.  He does have slight limitation in internal rotation.  Negative FADIR.  Negative straight leg raise.  He is neurovascularly intact distally.  Right shoulder exam shows full active range of motion all planes with the exception of internal rotation.  He can internally rotate to  L5.  He has full strength throughout.  Negative empty can and negative cross body adduction.  No tenderness the AC joint.  He is neurovascular intact distally.  Specialty Comments:  No specialty comments available.  Imaging: XR HIPS BILAT W OR W/O PELVIS 3-4 VIEWS  Result Date: 03/07/2020 X-rays demonstrate moderate degenerative changes to both hips right greater than left  XR Shoulder Right  Result Date: 03/07/2020 X-rays demonstrate [facility] joint space narrowing.  Otherwise, no acute findings    PMFS History: Patient Active Problem List   Diagnosis Date Noted   Benign essential tremor 02/04/2018   Saddle embolism of pulmonary artery- bilateral 09/03/2012   Hypertension  09/03/2012   Hyperlipidemia 09/03/2012   CKD (chronic kidney disease) stage 3, GFR 30-59 ml/min 09/03/2012   History of gout 09/03/2012   Anemia -baseline hemoglobin unknown 09/03/2012   Dyspnea 09/03/2012   Tachycardia 09/03/2012   Past Medical History:  Diagnosis Date   Anemia -baseline hemoglobin unknown 09/03/2012   Arthritis    Asthma    COPD (chronic obstructive pulmonary disease) (HCC)    Gout    Hypercholesterolemia    Hypertension    Hyperuricemia    Pulmonary embolism (HCC)    bilat following tendon repair   Renal disorder    Shortness of breath    Tremor     Family History  Problem Relation Age of Onset   Pancreatic cancer Mother    Cardiomyopathy Father    Tremor Father     Past Surgical History:  Procedure Laterality Date   achillis tendon     CARDIAC CATHETERIZATION     Social History   Occupational History   Not on file  Tobacco Use   Smoking status: Former Smoker    Types: Cigars    Quit date: 08/18/1991    Years since quitting: 28.5   Smokeless tobacco: Never Used  Substance and Sexual Activity   Alcohol use: Yes    Comment: occasional   Drug use: No   Sexual activity: Yes

## 2020-10-23 ENCOUNTER — Encounter (INDEPENDENT_AMBULATORY_CARE_PROVIDER_SITE_OTHER): Payer: Self-pay | Admitting: *Deleted

## 2020-11-16 ENCOUNTER — Other Ambulatory Visit (HOSPITAL_COMMUNITY): Payer: Self-pay | Admitting: Radiology

## 2020-11-16 DIAGNOSIS — R0609 Other forms of dyspnea: Secondary | ICD-10-CM

## 2020-11-16 DIAGNOSIS — R0600 Dyspnea, unspecified: Secondary | ICD-10-CM

## 2021-01-01 ENCOUNTER — Ambulatory Visit: Payer: Medicare HMO | Admitting: Orthopaedic Surgery

## 2021-01-01 ENCOUNTER — Ambulatory Visit (INDEPENDENT_AMBULATORY_CARE_PROVIDER_SITE_OTHER): Payer: Medicare HMO

## 2021-01-01 ENCOUNTER — Ambulatory Visit: Payer: Self-pay

## 2021-01-01 ENCOUNTER — Other Ambulatory Visit: Payer: Self-pay

## 2021-01-01 DIAGNOSIS — M1711 Unilateral primary osteoarthritis, right knee: Secondary | ICD-10-CM | POA: Diagnosis not present

## 2021-01-01 DIAGNOSIS — M1611 Unilateral primary osteoarthritis, right hip: Secondary | ICD-10-CM | POA: Insufficient documentation

## 2021-01-01 DIAGNOSIS — M1712 Unilateral primary osteoarthritis, left knee: Secondary | ICD-10-CM | POA: Diagnosis not present

## 2021-01-01 DIAGNOSIS — M1612 Unilateral primary osteoarthritis, left hip: Secondary | ICD-10-CM

## 2021-01-01 NOTE — Progress Notes (Signed)
Office Visit Note   Patient: Troy Gaines           Date of Birth: [birth year]           MRN: [id number] Visit Date: 01/01/2021              Requested by: Troy Rio, MD Troy Gaines,  [place] [address] PCP: Troy Rio, MD   Assessment & Plan: Visit Diagnoses:  1. Primary osteoarthritis of right knee   2. Primary osteoarthritis of left knee   3. Primary osteoarthritis of left hip   4. Primary osteoarthritis of right hip     Plan: Impression is bilateral hip DJD and bilateral knee DJD.  Again we had a detailed discussion on treatment options that included both surgical and nonsurgical options.  He wants to take things very slowly and very conservatively so he will just stick with over-the-counter medications.  Explained to the patient that glucosamine has not been shown to have clinically significant improvement in OA compared to placebo.  Arthritis panel obtained today.  Follow-up as needed.  Follow-Up Instructions: Return if symptoms worsen or fail to improve.   Orders:  Orders Placed This Encounter  Procedures  . XR KNEE 3 VIEW LEFT  . XR KNEE 3 VIEW RIGHT  . Antinuclear Antib (ANA)  . Sed Rate (ESR)  . Uric acid  . Rheumatoid Factor   No orders of the defined types were placed in this encounter.     Procedures: No procedures performed   Clinical Data: No additional findings.   Subjective: Chief Complaint  Patient presents with  . Lower Back - Follow-up  . Right Hip - Follow-up  . Left Hip - Follow-up  . Right Knee - Follow-up  . Left Knee - Follow-up    Troy Gaines comes in today for evaluation of chronic bilateral hip pain as well as bilateral knee pain.  He has a history of gout.  He feels that he has pain throughout his body that is worse with exercise but he still tries to be active.  He states that he mainly has increased pain after a day of being active.  Denies any injuries.   Review of Systems  Constitutional:  Negative.   All other systems reviewed and are negative.    Objective: Vital Signs: There were no vitals taken for this visit.  Physical Exam Vitals and nursing note reviewed.  Constitutional:      Appearance: He is well-developed and well-nourished.  Pulmonary:     Effort: Pulmonary effort is normal.  Abdominal:     Palpations: Abdomen is soft.  Skin:    General: Skin is warm.  Neurological:     Mental Status: He is alert and oriented to person, place, and time.  Psychiatric:        Mood and Affect: Mood and affect normal.        Behavior: Behavior normal.        Thought Content: Thought content normal.        Judgment: Judgment normal.     Ortho Exam   Bilateral hip exams are unchanged.  Bilateral knees show no effusion.  There is a moderate amount of crepitus with range of motion.  Collaterals and cruciates are stable.  Slight restriction range of motion. Specialty Comments:  No specialty comments available.  Imaging: XR KNEE 3 VIEW LEFT  Result Date: 01/01/2021 Moderate arthritis with early joint space narrowing  XR KNEE 3  VIEW RIGHT  Result Date: 01/01/2021 Moderately severe right knee arthritis with joint space narrowing    PMFS History: Patient Active Problem List   Diagnosis Date Noted  . Primary osteoarthritis of right knee 01/01/2021  . Primary osteoarthritis of left knee 01/01/2021  . Primary osteoarthritis of left hip 01/01/2021  . Primary osteoarthritis of right hip 01/01/2021  . Benign essential tremor 02/04/2018  . Saddle embolism of pulmonary artery- bilateral 09/03/2012  . Hypertension 09/03/2012  . Hyperlipidemia 09/03/2012  . CKD (chronic kidney disease) stage 3, GFR 30-59 ml/min (HCC) 09/03/2012  . History of gout 09/03/2012  . Anemia -baseline hemoglobin unknown 09/03/2012  . Dyspnea 09/03/2012  . Tachycardia 09/03/2012   Past Medical History:  Diagnosis Date  . Anemia -baseline hemoglobin unknown 09/03/2012  . Arthritis   . Asthma    . COPD (chronic obstructive pulmonary disease) ([place])   . Gout   . Hypercholesterolemia   . Hypertension   . Hyperuricemia   . Pulmonary embolism ([place])    bilat following tendon repair  . Renal disorder   . Shortness of breath   . Tremor     Family History  Problem Relation Age of Onset  . Pancreatic cancer Mother   . Cardiomyopathy Father   . Tremor Father     Past Surgical History:  Procedure Laterality Date  . achillis tendon    . CARDIAC CATHETERIZATION     Social History   Occupational History  . Not on file  Tobacco Use  . Smoking status: Former Smoker    Types: Cigars    Quit date: 08/18/1991    Years since quitting: 29.3  . Smokeless tobacco: Never Used  Substance and Sexual Activity  . Alcohol use: Yes    Comment: occasional  . Drug use: No  . Sexual activity: Yes

## 2021-01-03 LAB — ANA: Anti Nuclear Antibody (ANA): NEGATIVE

## 2021-01-03 LAB — SEDIMENTATION RATE: Sed Rate: 2 mm/h (ref 0–20)

## 2021-01-03 LAB — URIC ACID: Uric Acid, Serum: 7.1 mg/dL (ref 4.0–8.0)

## 2021-01-03 LAB — RHEUMATOID FACTOR: Rheumatoid fact SerPl-aCnc: <14 IU/mL (ref <14)

## 2021-01-04 ENCOUNTER — Encounter (HOSPITAL_COMMUNITY): Payer: Medicare HMO

## 2021-01-08 ENCOUNTER — Other Ambulatory Visit: Payer: Self-pay

## 2021-01-08 ENCOUNTER — Ambulatory Visit (HOSPITAL_COMMUNITY)
Admission: RE | Admit: 2021-01-08 | Discharge: 2021-01-08 | Disposition: A | Discharge status: Home / Self Care | Payer: Medicare HMO | Source: Ambulatory Visit | Attending: Family Medicine | Admitting: Family Medicine

## 2021-01-08 DIAGNOSIS — R0600 Dyspnea, unspecified: Secondary | ICD-10-CM | POA: Diagnosis present

## 2021-01-08 LAB — PULMONARY FUNCTION TEST
DL/VA % pred: 110 %
DL/VA: 4.48 ml/min/mmHg/L
DLCO unc % pred: 99 %
DLCO unc: 27.89 ml/min/mmHg
FEF 25-75 Post: 1.37 L/sec
FEF 25-75 Pre: 0.9 L/sec
FEF2575-%Change-Post: 52 %
FEF2575-%Pred-Post: 48 %
FEF2575-%Pred-Pre: 31 %
FEV1-%Change-Post: 15 %
FEV1-%Pred-Post: 76 %
FEV1-%Pred-Pre: 66 %
FEV1-Post: 2.46 L
FEV1-Pre: 2.13 L
FEV1FVC-%Change-Post: 11 %
FEV1FVC-%Pred-Pre: 70 %
FEV6-%Change-Post: 8 %
FEV6-%Pred-Post: 95 %
FEV6-%Pred-Pre: 88 %
FEV6-Post: 3.85 L
FEV6-Pre: 3.54 L
FEV6FVC-%Change-Post: 4 %
FEV6FVC-%Pred-Post: 98 %
FEV6FVC-%Pred-Pre: 94 %
FVC-%Change-Post: 3 %
FVC-%Pred-Post: 96 %
FVC-%Pred-Pre: 92 %
FVC-Post: 4.05 L
FVC-Pre: 3.9 L
Post FEV1/FVC ratio: 61 %
Post FEV6/FVC ratio: 95 %
Pre FEV1/FVC ratio: 55 %
Pre FEV6/FVC Ratio: 91 %
RV % pred: 103 %
RV: 2.58 L
TLC % pred: 90 %
TLC: 6.71 L

## 2021-01-08 MED ORDER — ALBUTEROL SULFATE (2.5 MG/3ML) 0.083% IN NEBU
2.5000 mg | INHALATION_SOLUTION | Freq: Once | RESPIRATORY_TRACT | Status: AC
  Administered 2021-01-08: 2.5 mg via RESPIRATORY_TRACT

## 2021-03-07 ENCOUNTER — Encounter (INDEPENDENT_AMBULATORY_CARE_PROVIDER_SITE_OTHER): Payer: Self-pay

## 2021-03-26 ENCOUNTER — Encounter (INDEPENDENT_AMBULATORY_CARE_PROVIDER_SITE_OTHER): Payer: Self-pay

## 2021-03-26 ENCOUNTER — Other Ambulatory Visit (INDEPENDENT_AMBULATORY_CARE_PROVIDER_SITE_OTHER): Payer: Self-pay

## 2021-03-26 ENCOUNTER — Telehealth (INDEPENDENT_AMBULATORY_CARE_PROVIDER_SITE_OTHER): Payer: Self-pay

## 2021-03-26 ENCOUNTER — Encounter (INDEPENDENT_AMBULATORY_CARE_PROVIDER_SITE_OTHER): Payer: Self-pay | Admitting: *Deleted

## 2021-03-26 DIAGNOSIS — Z1211 Encounter for screening for malignant neoplasm of colon: Secondary | ICD-10-CM

## 2021-03-26 MED ORDER — NA SULFATE-K SULFATE-MG SULF 17.5-3.13-1.6 GM/177ML PO SOLN: 354.0000 mL | Freq: Once | ORAL | 0 refills | Status: AC

## 2021-03-26 NOTE — Telephone Encounter (Signed)
LeighAnn Abrar Bilton, CMA  

## 2021-03-26 NOTE — Telephone Encounter (Signed)
Ok to schedule.  Thanks,  Arnita Koons Castaneda Mayorga, MD Gastroenterology and Hepatology [facility]  

## 2021-03-26 NOTE — Telephone Encounter (Signed)
Referring MD/PCP: Wyline Mood  Procedure: Tcs  Reason/Indication:  Screening  Has patient had this procedure before?  yes  If so, when, by whom and where?  2010  Is there a family history of colon cancer?  no  Who?  What age when diagnosed?    Is patient diabetic?   no      Does patient have prosthetic heart valve or mechanical valve?  no  Do you have a pacemaker/defibrillator?  no  Has patient ever had endocarditis/atrial fibrillation? no  Have you had a stroke/heart attack last 6 mths? no  Does patient use oxygen? no  Has patient had joint replacement within last 12 months?  no  Is patient constipated or do they take laxatives? no  Does patient have a history of alcohol/drug use?  no  Is patient on blood thinner such as Coumadin, Plavix and/or Aspirin? no  Do you take medicine for weight loss. no  Medications: Xarelto 20mg  daily (Dr ), Metoprolol 100 mg daily, Pravastatin 40 mg 3 times a week, Ramipril 5 mg daily, Tamsulosin 0.4 mg daily, Osteo Bioflex daily, Vit B complex daily  Allergies: nkda  Medication Adjustment per Dr Wyline Mood: Xarelto 2 days prior  Procedure date & time: 04/09/21 / AM

## 2021-04-08 ENCOUNTER — Other Ambulatory Visit (HOSPITAL_COMMUNITY)
Admission: RE | Admit: 2021-04-08 | Discharge: 2021-04-08 | Disposition: A | Discharge status: Home / Self Care | Payer: Medicare HMO | Source: Ambulatory Visit | Attending: Gastroenterology | Admitting: Gastroenterology

## 2021-04-08 ENCOUNTER — Other Ambulatory Visit: Payer: Self-pay

## 2021-04-08 DIAGNOSIS — Z20822 Contact with and (suspected) exposure to covid-19: Secondary | ICD-10-CM | POA: Insufficient documentation

## 2021-04-08 DIAGNOSIS — Z01812 Encounter for preprocedural laboratory examination: Secondary | ICD-10-CM | POA: Insufficient documentation

## 2021-04-08 LAB — SARS CORONAVIRUS 2 (TAT 6-24 HRS): SARS Coronavirus 2: NEGATIVE

## 2021-04-09 ENCOUNTER — Ambulatory Visit (HOSPITAL_COMMUNITY): Payer: Medicare HMO | Admitting: Anesthesiology

## 2021-04-09 ENCOUNTER — Encounter (HOSPITAL_COMMUNITY): Payer: Self-pay | Admitting: Gastroenterology

## 2021-04-09 ENCOUNTER — Encounter (INDEPENDENT_AMBULATORY_CARE_PROVIDER_SITE_OTHER): Payer: Self-pay | Admitting: *Deleted

## 2021-04-09 ENCOUNTER — Encounter (HOSPITAL_COMMUNITY)
Admission: RE | Disposition: A | Discharge status: Home / Self Care | Payer: Self-pay | Source: Home / Self Care | Attending: Gastroenterology

## 2021-04-09 ENCOUNTER — Other Ambulatory Visit: Payer: Self-pay

## 2021-04-09 ENCOUNTER — Ambulatory Visit (HOSPITAL_COMMUNITY)
Admission: RE | Admit: 2021-04-09 | Discharge: 2021-04-09 | Disposition: A | Discharge status: Home / Self Care | Payer: Medicare HMO | Attending: Gastroenterology | Admitting: Gastroenterology

## 2021-04-09 DIAGNOSIS — Z87891 Personal history of nicotine dependence: Secondary | ICD-10-CM | POA: Diagnosis not present

## 2021-04-09 DIAGNOSIS — J449 Chronic obstructive pulmonary disease, unspecified: Secondary | ICD-10-CM | POA: Diagnosis not present

## 2021-04-09 DIAGNOSIS — Z1211 Encounter for screening for malignant neoplasm of colon: Secondary | ICD-10-CM

## 2021-04-09 DIAGNOSIS — Z79899 Other long term (current) drug therapy: Secondary | ICD-10-CM | POA: Diagnosis not present

## 2021-04-09 DIAGNOSIS — Z86711 Personal history of pulmonary embolism: Secondary | ICD-10-CM | POA: Diagnosis not present

## 2021-04-09 DIAGNOSIS — I10 Essential (primary) hypertension: Secondary | ICD-10-CM | POA: Insufficient documentation

## 2021-04-09 DIAGNOSIS — K648 Other hemorrhoids: Secondary | ICD-10-CM | POA: Diagnosis not present

## 2021-04-09 DIAGNOSIS — Z7901 Long term (current) use of anticoagulants: Secondary | ICD-10-CM | POA: Insufficient documentation

## 2021-04-09 DIAGNOSIS — M1990 Unspecified osteoarthritis, unspecified site: Secondary | ICD-10-CM | POA: Insufficient documentation

## 2021-04-09 DIAGNOSIS — E785 Hyperlipidemia, unspecified: Secondary | ICD-10-CM | POA: Diagnosis not present

## 2021-04-09 HISTORY — PX: COLONOSCOPY WITH PROPOFOL: SHX5780

## 2021-04-09 LAB — HM COLONOSCOPY

## 2021-04-09 SURGERY — COLONOSCOPY WITH PROPOFOL
Anesthesia: General

## 2021-04-09 MED ORDER — LIDOCAINE HCL (PF) 2 % IJ SOLN
INTRAMUSCULAR | Status: AC
  Filled 2021-04-09: qty 10

## 2021-04-09 MED ORDER — PROPOFOL 10 MG/ML IV BOLUS
INTRAVENOUS | Status: DC | PRN
  Administered 2021-04-09: 50 mg via INTRAVENOUS
  Administered 2021-04-09 (×2): 20 mg via INTRAVENOUS

## 2021-04-09 MED ORDER — EPHEDRINE SULFATE 50 MG/ML IJ SOLN
INTRAMUSCULAR | Status: DC | PRN
  Administered 2021-04-09: 10 mg via INTRAVENOUS

## 2021-04-09 MED ORDER — EPHEDRINE 5 MG/ML INJ
INTRAVENOUS | Status: AC
  Filled 2021-04-09: qty 10

## 2021-04-09 MED ORDER — CHLORHEXIDINE GLUCONATE CLOTH 2 % EX PADS: 6.0000 | MEDICATED_PAD | Freq: Once | CUTANEOUS | Status: DC

## 2021-04-09 MED ORDER — PROPOFOL 10 MG/ML IV BOLUS
INTRAVENOUS | Status: AC
  Filled 2021-04-09: qty 60

## 2021-04-09 MED ORDER — STERILE WATER FOR IRRIGATION IR SOLN
Status: DC | PRN
  Administered 2021-04-09: 100 mL

## 2021-04-09 MED ORDER — PROPOFOL 500 MG/50ML IV EMUL
INTRAVENOUS | Status: DC | PRN
  Administered 2021-04-09: 150 ug/kg/min via INTRAVENOUS

## 2021-04-09 MED ORDER — LACTATED RINGERS IV SOLN: INTRAVENOUS | Status: DC

## 2021-04-09 NOTE — Anesthesia Postprocedure Evaluation (Signed)
Anesthesia Post Note  Patient: Troy Gaines  Procedure(s) Performed: COLONOSCOPY WITH PROPOFOL (N/A )  Patient location during evaluation: Endoscopy Anesthesia Type: General Level of consciousness: awake and alert and oriented Pain management: pain level controlled Vital Signs Assessment: post-procedure vital signs reviewed and stable Respiratory status: spontaneous breathing Cardiovascular status: blood pressure returned to baseline and stable Postop Assessment: no apparent nausea or vomiting Anesthetic complications: no   No complications documented.   Last Vitals:  Vitals:   04/09/21 0720  BP: (!) 146/92  Pulse: 78  Resp: 17  Temp: 36.6 C  SpO2: 99%    Last Pain:  Vitals:   04/09/21 0806  TempSrc:   PainSc: 0-No pain                 Brielle Moro

## 2021-04-09 NOTE — Anesthesia Preprocedure Evaluation (Addendum)
Anesthesia Evaluation  Patient identified by MRN, date of birth, ID band Patient awake    Reviewed: Allergy & Precautions, NPO status , Patient's Chart, lab work & pertinent test results, reviewed documented beta blocker date and time   Airway Mallampati: II  TM Distance: >3 FB Neck ROM: Full    Dental  (+) Dental Advisory Given, Teeth Intact Crowns :   Pulmonary shortness of breath and with exertion, asthma , COPD, former smoker, PE   Pulmonary exam normal breath sounds clear to auscultation       Cardiovascular Exercise Tolerance: Good hypertension, Pt. on medications and Pt. on home beta blockers Normal cardiovascular exam Rhythm:Regular Rate:Normal     Neuro/Psych  Neuromuscular disease (tremor)    GI/Hepatic negative GI ROS, Neg liver ROS,   Endo/Other  negative endocrine ROS  Renal/GU Renal InsufficiencyRenal disease     Musculoskeletal  (+) Arthritis , Osteoarthritis,    Abdominal   Peds  Hematology  (+) anemia ,   Anesthesia Other Findings   Reproductive/Obstetrics                            Anesthesia Physical Anesthesia Plan  ASA: III  Anesthesia Plan: General   Post-op Pain Management:    Induction: Intravenous  PONV Risk Score and Plan: Propofol infusion  Airway Management Planned: Nasal Cannula and Natural Airway  Additional Equipment:   Intra-op Plan:   Post-operative Plan:   Informed Consent: I have reviewed the patients History and Physical, chart, labs and discussed the procedure including the risks, benefits and alternatives for the proposed anesthesia with the patient or authorized representative who has indicated his/her understanding and acceptance.       Plan Discussed with: CRNA and Surgeon  Anesthesia Plan Comments:         Anesthesia Quick Evaluation

## 2021-04-09 NOTE — Transfer of Care (Signed)
Immediate Anesthesia Transfer of Care Note  Patient: Troy Gaines  Procedure(s) Performed: COLONOSCOPY WITH PROPOFOL (N/A )  Patient Location: Endoscopy Unit  Anesthesia Type:General  Level of Consciousness: awake  Airway & Oxygen Therapy: Patient Spontanous Breathing  Post-op Assessment: Report given to RN  Post vital signs: Reviewed  Last Vitals:  Vitals Value Taken Time  BP    Temp    Pulse    Resp    SpO2      Last Pain:  Vitals:   04/09/21 0806  TempSrc:   PainSc: 0-No pain         Complications: No complications documented.

## 2021-04-09 NOTE — Discharge Instructions (Signed)
You are being discharged to home.  Resume your previous diet.  Your physician has recommended a repeat colonoscopy in 10 years for screening purposes.  Restart Xarelto today.   Colonoscopy, Adult, Care After This sheet gives you information about how to care for yourself after your procedure. Your doctor may also give you more specific instructions. If you have problems or questions, call your doctor. What can I expect after the procedure? After the procedure, it is common to have:  A small amount of blood in your poop (stool) for 24 hours.  Some gas.  Mild cramping or bloating in your belly (abdomen). Follow these instructions at home: Eating and drinking  Drink enough fluid to keep your pee (urine) pale yellow.  Follow instructions from your doctor about what you cannot eat or drink.  Return to your normal diet as told by your doctor. Avoid heavy or fried foods that are hard to digest.   Activity  Rest as told by your doctor.  Do not sit for a long time without moving. Get up to take short walks every 1-2 hours. This is important. Ask for help if you feel weak or unsteady.  Return to your normal activities as told by your doctor. Ask your doctor what activities are safe for you. To help cramping and bloating:  Try walking around.  Put heat on your belly as told by your doctor. Use the heat source that your doctor recommends, such as a moist heat pack or a heating pad. ? Put a towel between your skin and the heat source. ? Leave the heat on for 20-30 minutes. ? Remove the heat if your skin turns bright red. This is very important if you are unable to feel pain, heat, or cold. You may have a greater risk of getting burned.   General instructions  If you were given a medicine to help you relax (sedative) during your procedure, it can affect you for many hours. Do not drive or use machinery until your doctor says that it is safe.  For the first 24 hours after the  procedure: ? Do not sign important documents. ? Do not drink alcohol. ? Do your daily activities more slowly than normal. ? Eat foods that are soft and easy to digest.  Take over-the-counter or prescription medicines only as told by your doctor.  Keep all follow-up visits as told by your doctor. This is important. Contact a doctor if:  You have blood in your poop 2-3 days after the procedure. Get help right away if:  You have more than a small amount of blood in your poop.  You see large clumps of tissue (blood clots) in your poop.  Your belly is swollen.  You feel like you may vomit (nauseous).  You vomit.  You have a fever.  You have belly pain that gets worse, and medicine does not help your pain. Summary  After the procedure, it is common to have a small amount of blood in your poop. You may also have mild cramping and bloating in your belly.  If you were given a medicine to help you relax (sedative) during your procedure, it can affect you for many hours. Do not drive or use machinery until your doctor says that it is safe.  Get help right away if you have a lot of blood in your poop, feel like you may vomit, have a fever, or have more belly pain. This information is not intended to replace advice given  to you by your health care provider. Make sure you discuss any questions you have with your health care provider. Document Revised: 10/21/2019 Document Reviewed: 07/11/2019 Elsevier Patient Education  2021 [employer]  Hemorrhoids Hemorrhoids are swollen veins in and around the rectum or anus. There are two types of hemorrhoids:  Internal hemorrhoids. These occur in the veins that are just inside the rectum. They may poke through to the outside and become irritated and painful.  External hemorrhoids. These occur in the veins that are outside the anus and can be felt as a painful swelling or hard lump near the anus. Most hemorrhoids do not cause serious problems, and  they can be managed with home treatments such as diet and lifestyle changes. If home treatments do not help the symptoms, procedures can be done to shrink or remove the hemorrhoids. What are the causes? This condition is caused by increased pressure in the anal area. This pressure may result from various things, including:  Constipation.  Straining to have a bowel movement.  Diarrhea.  Pregnancy.  Obesity.  Sitting for long periods of time.  Heavy lifting or other activity that causes you to strain.  Anal sex.  Riding a bike for a long period of time. What are the signs or symptoms? Symptoms of this condition include:  Pain.  Anal itching or irritation.  Rectal bleeding.  Leakage of stool (feces).  Anal swelling.  One or more lumps around the anus. How is this diagnosed? This condition can often be diagnosed through a visual exam. Other exams or tests may also be done, such as:  An exam that involves feeling the rectal area with a gloved hand (digital rectal exam).  An exam of the anal canal that is done using a small tube (anoscope).  A blood test, if you have lost a significant amount of blood.  A test to look inside the colon using a flexible tube with a camera on the end (sigmoidoscopy or colonoscopy). How is this treated? This condition can usually be treated at home. However, various procedures may be done if dietary changes, lifestyle changes, and other home treatments do not help your symptoms. These procedures can help make the hemorrhoids smaller or remove them completely. Some of these procedures involve surgery, and others do not. Common procedures include:  Rubber band ligation. Rubber bands are placed at the base of the hemorrhoids to cut off their blood supply.  Sclerotherapy. Medicine is injected into the hemorrhoids to shrink them.  Infrared coagulation. A type of light energy is used to get rid of the hemorrhoids.  Hemorrhoidectomy surgery. The  hemorrhoids are surgically removed, and the veins that supply them are tied off.  Stapled hemorrhoidopexy surgery. The surgeon staples the base of the hemorrhoid to the rectal wall. Follow these instructions at home: Eating and drinking  Eat foods that have a lot of fiber in them, such as whole grains, beans, nuts, fruits, and vegetables.  Ask your health care provider about taking products that have added fiber (fiber supplements).  Reduce the amount of fat in your diet. You can do this by eating low-fat dairy products, eating less red meat, and avoiding processed foods.  Drink enough fluid to keep your urine pale yellow.   Managing pain and swelling  Take warm sitz baths for 20 minutes, 3-4 times a day to ease pain and discomfort. You may do this in a bathtub or using a portable sitz bath that fits over the toilet.  If  directed, apply ice to the affected area. Using ice packs between sitz baths may be helpful. ? Put ice in a plastic bag. ? Place a towel between your skin and the bag. ? Leave the ice on for 20 minutes, 2-3 times a day.   General instructions  Take over-the-counter and prescription medicines only as told by your health care provider.  Use medicated creams or suppositories as told.  Get regular exercise. Ask your health care provider how much and what kind of exercise is best for you. In general, you should do moderate exercise for at least 30 minutes on most days of the week (150 minutes each week). This can include activities such as walking, biking, or yoga.  Go to the bathroom when you have the urge to have a bowel movement. Do not wait.  Avoid straining to have bowel movements.  Keep the anal area dry and clean. Use wet toilet paper or moist towelettes after a bowel movement.  Do not sit on the toilet for long periods of time. This increases blood pooling and pain.  Keep all follow-up visits as told by your health care provider. This is important. Contact a  health care provider if you have:  Increasing pain and swelling that are not controlled by treatment or medicine.  Difficulty having a bowel movement, or you are unable to have a bowel movement.  Pain or inflammation outside the area of the hemorrhoids. Get help right away if you have:  Uncontrolled bleeding from your rectum. Summary  Hemorrhoids are swollen veins in and around the rectum or anus.  Most hemorrhoids can be managed with home treatments such as diet and lifestyle changes.  Taking warm sitz baths can help ease pain and discomfort.  In severe cases, procedures or surgery can be done to shrink or remove the hemorrhoids. This information is not intended to replace advice given to you by your health care provider. Make sure you discuss any questions you have with your health care provider. Document Revised: 05/13/2019 Document Reviewed: 05/06/2018 Elsevier Patient Education  2021 [employer].

## 2021-04-09 NOTE — H&P (Signed)
Troy Gaines is an 70 y.o. male.   Chief Complaint: screening colonoscopy HPI: 70 year old male with medical history of COPD, hyperlipidemia, hypertension,PE on Xarelto, asthma arthritis,  coming for screening colonoscopy.  Last colonoscopy was performed close to 11 years ago which was normal per the patient.  The patient denies having any complaints such as melena, hematochezia, abdominal pain or distention, change in her bowel movement consistency or frequency, no changes in her weight recently.  No family history of colorectal cancer.   Past Medical History:  Diagnosis Date  . Anemia -baseline hemoglobin unknown 09/03/2012  . Arthritis   . Asthma   . COPD (chronic obstructive pulmonary disease) (HCC)   . Gout   . Hypercholesterolemia   . Hypertension   . Hyperuricemia   . Pulmonary embolism (HCC)    bilat following tendon repair  . Renal disorder   . Shortness of breath   . Tremor     Past Surgical History:  Procedure Laterality Date  . achillis tendon    . CARDIAC CATHETERIZATION      Family History  Problem Relation Age of Onset  . Pancreatic cancer Mother   . Cardiomyopathy Father   . Tremor Father    Social History:  reports that he quit smoking about 29 years ago. His smoking use included cigars. He has never used smokeless tobacco. He reports current alcohol use. He reports that he does not use drugs.  Allergies: No Known Allergies  Medications Prior to Admission  Medication Sig Dispense Refill  . albuterol (PROVENTIL HFA;VENTOLIN HFA) 108 (90 Base) MCG/ACT inhaler Inhale 2 puffs into the lungs every 6 (six) hours as needed for wheezing or shortness of breath.  0  . B Complex-Folic Acid (B COMPLEX-VITAMIN B12 PO) Take 1 tablet by mouth daily.     . [place]-Glucosamine-Vit D (OSTEO BI-FLEX ONE PER DAY) TABS Take 1 tablet by mouth daily.    . hydrocortisone cream 1 % Apply 1 application topically daily as needed for itching.    . metoprolol succinate  (TOPROL-XL) 100 MG 24 hr tablet Take 100 mg by mouth daily. Take with or immediately following a meal.    . Multiple Vitamins-Minerals (LUTEIN-ZEAXANTHIN) TABS Take 1 tablet by mouth daily.    . pravastatin (PRAVACHOL) 40 MG tablet Take 40 mg by mouth 3 (three) times a week.    . ramipril (ALTACE) 5 MG capsule Take 1 capsule (5 mg total) by mouth daily. 90 capsule 3  . Tamsulosin HCl (FLOMAX) 0.4 MG CAPS Take 0.4 mg by mouth at bedtime.    Carlena Hurl 20 MG TABS tablet Take 20 mg by mouth at bedtime.  1    Results for orders placed or performed during the hospital encounter of 04/08/21 (from the past 48 hour(s))  SARS CORONAVIRUS 2 (TAT 6-24 HRS) Nasopharyngeal Nasopharyngeal Swab     Status: None   Collection Time: 04/08/21  9:08 AM   Specimen: Nasopharyngeal Swab  Result Value Ref Range   SARS Coronavirus 2 NEGATIVE NEGATIVE    Comment: (NOTE) SARS-CoV-2 target nucleic acids are NOT DETECTED.  The SARS-CoV-2 RNA is generally detectable in upper and lower respiratory specimens during the acute phase of infection. Negative results do not preclude SARS-CoV-2 infection, do not rule out co-infections with other pathogens, and should not be used as the sole basis for treatment or other patient management decisions. Negative results must be combined with clinical observations, patient history, and epidemiological information. The expected result is Negative.  Fact  Sheet for Patients: HairSlick.no  Fact Sheet for Healthcare Providers: quierodirigir.com  This test is not yet approved or cleared by the [place] FDA and  has been authorized for detection and/or diagnosis of SARS-CoV-2 by FDA under an Emergency Use Authorization (EUA). This EUA will remain  in effect (meaning this test can be used) for the duration of the COVID-19 declaration under Se ction 564(b)(1) of the Act, 21 U.S.C. section 360bbb-3(b)(1), unless the  authorization is terminated or revoked sooner.  Performed at [facility], [address] [place], [place] [address]    No results found.  Review of Systems  Constitutional: Negative.   HENT: Negative.   Eyes: Negative.   Respiratory: Negative.   Cardiovascular: Negative.   Gastrointestinal: Negative.   Endocrine: Negative.   Genitourinary: Negative.   Musculoskeletal: Negative.   Skin: Negative.   Allergic/Immunologic: Negative.   Neurological: Negative.   Hematological: Negative.   Psychiatric/Behavioral: Negative.     Blood pressure (!) 146/92, pulse 78, temperature 97.8 F (36.6 C), temperature source Oral, resp. rate 17, height 6' (1.829 m), weight 87.5 kg, SpO2 99 %. Physical Exam  GENERAL: The patient is AO x3, in no acute distress. HEENT: Head is normocephalic and atraumatic. EOMI are intact. Mouth is well hydrated and without lesions. NECK: Supple. No masses LUNGS: Clear to auscultation. No presence of rhonchi/wheezing/rales. Adequate chest expansion HEART: RRR, normal s1 and s2. ABDOMEN: Soft, nontender, no guarding, no peritoneal signs, and nondistended. BS +. No masses. EXTREMITIES: Without any cyanosis, clubbing, rash, lesions or edema. NEUROLOGIC: AOx3, no focal motor deficit. SKIN: no jaundice, no rashes  Assessment/Plan 70 year old male with medical history of COPD, hyperlipidemia, hypertension,PE on Xarelto, asthma arthritis,  coming for screening colonoscopy. The patient is at average risk for colorectal cancer.  We will proceed with colonoscopy today.   Dolores Frame, MD 04/09/2021, 7:31 AM

## 2021-04-09 NOTE — Op Note (Signed)
[facility] Patient Name: Troy Gaines Procedure Date: 04/09/2021 7:52 AM MRN: [id number] Date of Birth: Dec 03, 1951 Attending MD: Katrinka Blazing ,  CSN: [id number] Age: 70 Admit Type: Outpatient Procedure:                Colonoscopy Indications:              Screening for colorectal malignant neoplasm Providers:                Katrinka Blazing, Sheran Fava, Burke Keels, Technician Referring MD:              Medicines:                Monitored Anesthesia Care Complications:            No immediate complications. Estimated Blood Loss:     Estimated blood loss: none. Procedure:                Pre-Anesthesia Assessment:                           - Prior to the procedure, a History and Physical                            was performed, and patient medications, allergies                            and sensitivities were reviewed. The patient's                            tolerance of previous anesthesia was reviewed.                           - The risks and benefits of the procedure and the                            sedation options and risks were discussed with the                            patient. All questions were answered and informed                            consent was obtained.                           - ASA Grade Assessment: II - A patient with mild                            systemic disease.                           After obtaining informed consent, the colonoscope                            was passed under direct vision. Throughout the  procedure, the patient's blood pressure, pulse, and                            oxygen saturations were monitored continuously. The                            [id number]-[id number] ([id number]) scope was introduced through                            the anus and advanced to the the cecum, identified                            by appendiceal orifice and ileocecal valve. The                             colonoscopy was performed without difficulty. The                            patient tolerated the procedure well. The quality                            of the bowel preparation was good. Scope In: 8:09:45 AM Scope Out: 8:32:53 AM Scope Withdrawal Time: 0 hours 12 minutes 24 seconds  Total Procedure Duration: 0 hours 23 minutes 8 seconds  Findings:      The perianal and digital rectal examinations were normal.      The colon (entire examined portion) appeared normal.      Non-bleeding internal hemorrhoids were found during retroflexion. The       hemorrhoids were small. Impression:               - The entire examined colon is normal.                           - Non-bleeding internal hemorrhoids.                           - No specimens collected. Moderate Sedation:      Per Anesthesia Care Recommendation:           - Discharge patient to home (ambulatory).                           - Resume previous diet.                           - Repeat colonoscopy in 10 years for screening                            purposes. Procedure Code(s):        --- Professional ---                           R4854, Colorectal cancer screening; colonoscopy on                            individual not meeting criteria for high risk Diagnosis  Code(s):        --- Professional ---                           Z12.11, Encounter for screening for malignant                            neoplasm of colon                           K64.8, Other hemorrhoids CPT copyright 2019 American Medical Association. All rights reserved. The codes documented in this report are preliminary and upon coder review may  be revised to meet current compliance requirements. Katrinka Blazing, MD Katrinka Blazing,  04/09/2021 8:36:02 AM This report has been signed electronically. Number of Addenda: 0

## 2021-04-15 ENCOUNTER — Encounter (HOSPITAL_COMMUNITY): Payer: Self-pay | Admitting: Gastroenterology

## 2021-10-14 ENCOUNTER — Encounter: Payer: Self-pay | Admitting: Neurology

## 2021-10-14 ENCOUNTER — Other Ambulatory Visit: Payer: Self-pay

## 2021-10-14 ENCOUNTER — Ambulatory Visit: Payer: Medicare HMO | Admitting: Neurology

## 2021-10-14 VITALS — BP 152/94 | HR 67 | Ht 72.0 in | Wt 198.0 lb

## 2021-10-14 DIAGNOSIS — G250 Essential tremor: Secondary | ICD-10-CM | POA: Diagnosis not present

## 2021-10-14 DIAGNOSIS — G4719 Other hypersomnia: Secondary | ICD-10-CM

## 2021-10-14 DIAGNOSIS — R0683 Snoring: Secondary | ICD-10-CM | POA: Diagnosis not present

## 2021-10-14 NOTE — Progress Notes (Signed)
[facility]  PATIENT: Troy Gaines DOB: [birth year]  REFERRING DOCTOR OR PCP: Sundra Aland, MD SOURCE: Patient, notes from primary care  _________________________________   HISTORICAL  CHIEF COMPLAINT:  Chief Complaint  Patient presents with   New Patient (Initial Visit)    RM 2, alone. Paper referral from Sundra Aland, MD for fatigue/sleep disturbance/no prior sleep study/no cpap use. Waking up tired. Unaware if he snores or not.    Tremors    Has benign tremors in L hand.    HISTORY OF PRESENT ILLNESS:  I had the pleasure of seeing your patient, Troy Gaines, at [facility] for neurologic consultation regarding his fatigue and poor sleep.  He is a 70 year old man who has had fatigue and sleepiness that has worsened this year.  He often falls asleep when reading or watching TV.   He has a strong FH of OSA (father, mother, brother and one sister)    He feels sleepy most days.   He can nap easily.  On a typical night he goes to sleep at midnight and is immediately asleep.   He lives alone and is not sure how loud his snoring is or if he has any pauses, gasping or snorting.  He wakes up 0-3 times a night sometimes to urinate.  He will quickly fall back asleep.   He does not feel refreshed in morning.  He sometimes has a headache upon awakening.   His mouth is often dry  I saw him > 70 years ago for a left hand tremor that is worse if he is nervous.    It increases when he holds something or writes.   He is on metoprolol.     His teeth chatter if he looks down for more than just a few seconds.  He feels the tremor is about the same now as it was 3 years ago.  He is on metoprolol and is uncertain how much it is helping.  The tremor is predominantly on the left and he is right-handed so it has not affected his handwriting or other fine motor tasks.  He has essential hypertension. And hyperlipidemia.  .   He has some back and hip pain.     EPWORTH SLEEPINESS SCALE  On a scale of 0 - 3 what is the chance of dozing:  Sitting and Reading:   3 Watching TV:    3 Sitting inactive in a public place: 1 Passenger in car for one hour: 0 Lying down to rest in the afternoon: 3 Sitting and talking to someone: 0 Sitting quietly after lunch:  1 In a car, stopped in traffic:  0  Total (out of 24):     11/24   mild sleepiness.        REVIEW OF SYSTEMS: Constitutional: No fevers, chills, sweats, or change in appetite.  He has fatigue and sleepiness Eyes: No visual changes, double vision, eye pain Ear, nose and throat: No hearing loss, ear pain, nasal congestion, sore throat Cardiovascular: No chest pain, palpitations Respiratory:  No shortness of breath at rest or with exertion.   No wheezes GastrointestinaI: No nausea, vomiting, diarrhea, abdominal pain, fecal incontinence Genitourinary: He has urinary hesitancy.  Chronic kidney disease. Musculoskeletal: He has hip and back pain Integumentary: No rash, pruritus, skin lesions Neurological: as above Psychiatric: No depression at this time.  No anxiety Endocrine: No palpitations, diaphoresis, change in appetite, change in weigh or increased thirst Hematologic/Lymphatic:  No anemia, purpura,  petechiae.  He is on Xarelto for pulmonary embolus Allergic/Immunologic: No itchy/runny eyes, nasal congestion, recent allergic reactions, rashes  ALLERGIES: No Known Allergies  HOME MEDICATIONS:  Current Outpatient Medications:    albuterol (PROVENTIL HFA;VENTOLIN HFA) 108 (90 Base) MCG/ACT inhaler, Inhale 2 puffs into the lungs every 6 (six) hours as needed for wheezing or shortness of breath., Disp: , Rfl: 0   B Complex-Folic Acid (B COMPLEX-VITAMIN B12 PO), Take 1 tablet by mouth 3 (three) times a week., Disp: , Rfl:    hydrocortisone cream 1 %, Apply 1 application topically daily as needed for itching., Disp: , Rfl:    metoprolol succinate (TOPROL-XL) 100 MG 24 hr tablet, Take 100 mg  by mouth daily. Take with or immediately following a meal., Disp: , Rfl:    Multiple Vitamins-Minerals (LUTEIN-ZEAXANTHIN) TABS, Take 1 tablet by mouth daily., Disp: , Rfl:    pravastatin (PRAVACHOL) 40 MG tablet, Take 40 mg by mouth 3 (three) times a week., Disp: , Rfl:    Tamsulosin HCl (FLOMAX) 0.4 MG CAPS, Take 0.4 mg by mouth at bedtime., Disp: , Rfl:    XARELTO 20 MG TABS tablet, Take 20 mg by mouth at bedtime., Disp: , Rfl: 1   ramipril (ALTACE) 5 MG capsule, Take 1 capsule (5 mg total) by mouth daily., Disp: 90 capsule, Rfl: 3  PAST MEDICAL HISTORY: Past Medical History:  Diagnosis Date   Anemia -baseline hemoglobin unknown 09/03/2012   Arthritis    Asthma    COPD (chronic obstructive pulmonary disease) (HCC)    Gout    Hypercholesterolemia    Hypertension    Hyperuricemia    Pulmonary embolism (HCC)    bilat following tendon repair   Renal disorder    Shortness of breath    Tremor     PAST SURGICAL HISTORY: Past Surgical History:  Procedure Laterality Date   achillis tendon     CARDIAC CATHETERIZATION     COLONOSCOPY WITH PROPOFOL N/A 04/09/2021   Procedure: COLONOSCOPY WITH PROPOFOL;  Surgeon: Dolores Frame, MD;  [facility];  Service: Gastroenterology;  Laterality: N/A;  AM    FAMILY HISTORY: Family History  Problem Relation Age of Onset   Pancreatic cancer Mother    Cardiomyopathy Father    Tremor Father     SOCIAL HISTORY:  Social History   Socioeconomic History   Marital status: Single    Spouse name: Not on file   Number of children: Not on file   Years of education: Not on file   Highest education level: Not on file  Occupational History   Not on file  Tobacco Use   Smoking status: Former    Types: Cigars    Quit date: 08/18/1991    Years since quitting: 30.1   Smokeless tobacco: Never  Substance and Sexual Activity   Alcohol use: Yes    Comment: occasional   Drug use: No   Sexual activity: Yes  Other Topics Concern    Not on file  Social History Narrative   Not on file   Social Determinants of Health   Financial Resource Strain: Not on file  Food Insecurity: Not on file  Transportation Needs: Not on file  Physical Activity: Not on file  Stress: Not on file  Social Connections: Not on file  Intimate Partner Violence: Not on file     PHYSICAL EXAM  Vitals:   10/14/21 1009  BP: (!) 152/94  Pulse: 67  SpO2: 97%  Weight: 198  lb (89.8 kg)  Height: 6' (1.829 m)    Body mass index is 26.85 kg/m.   General: The patient is well-developed and well-nourished and in no acute distress  HEENT:  Head is /AT.  Sclera are anicteric.    Neck: No carotid bruits are noted.  The neck is nontender.   Neck circumference is 16.25  Cardiovascular: The heart has a regular rate and rhythm with a normal S1 and S2. There were no murmurs, gallops or rubs.    Skin: Extremities are without rash or  edema.  Musculoskeletal:  Back is nontender  Neurologic Exam  Mental status: The patient is alert and oriented x 3 at the time of the examination. The patient has apparent normal recent and remote memory, with an apparently normal attention span and concentration ability.   Speech is normal.  Cranial nerves: Extraocular movements are full.   Facial symmetry is present. There is good facial sensation to soft touch bilaterally.Facial strength is normal.  Trapezius and sternocleidomastoid strength is normal. No dysarthria is noted.  The tongue is midline, and the patient has symmetric elevation of the soft palate. No obvious hearing deficits are noted.  Motor:  Very mild left hand tremor with intention.  Spirals have slight angulations on the left .  Muscle bulk is normal.   Tone is normal. Strength is  5 / 5 in all 4 extremities.   Sensory: Sensory testing is intact to pinprick, soft touch and vibration sensation in all 4 extremities.  Coordination: Cerebellar testing reveals good finger-nose-finger and  heel-to-shin bilaterally.  Gait and station: Station is normal.   Gait is normal. Tandem gait is normal. Romberg is negative.   Reflexes: Deep tendon reflexes are symmetric and normal bilaterally.         ASSESSMENT AND PLAN  Excessive daytime sleepiness - Plan: Home sleep test  Snoring - Plan: Home sleep test  Benign essential tremor   In summary, Mr. Besecker is a 70 year old man with excessive daytime sleepiness and fatigue, both worse over the last year.  He has a strong family history of OSA.  We will check a home sleep study to further characterize and consider CPAP or related therapy based on the results.  He will continue metoprolol for the essential tremor.  We discussed that I would be reluctant to add another medication as other medications that can help, may also make him more sleepy or fatigued (barbiturate and benzodiazepines).  We will let him know the results of the home sleep study after it is interpreted.  He will return to see [place] in 4 months or sooner if there are new or worsening neurologic symptoms or based on the results.  Thank you for asking me to see Mr. Steely.  Please let me know if I can be of further assistance with him or other patients in the future.    Jeiry Birnbaum A. Epimenio Foot, MD, [facility] 10/14/2021, 11:17 AM Certified in Neurology, Clinical Neurophysiology, Sleep Medicine and Neuroimaging  [facility] [address] [place], [place] [address] [phone]

## 2021-11-27 ENCOUNTER — Other Ambulatory Visit: Payer: Self-pay

## 2021-11-27 ENCOUNTER — Ambulatory Visit (INDEPENDENT_AMBULATORY_CARE_PROVIDER_SITE_OTHER): Payer: Medicare HMO

## 2021-11-27 ENCOUNTER — Encounter: Payer: Self-pay | Admitting: Orthopaedic Surgery

## 2021-11-27 ENCOUNTER — Ambulatory Visit: Payer: Medicare HMO | Admitting: Orthopaedic Surgery

## 2021-11-27 DIAGNOSIS — M160 Bilateral primary osteoarthritis of hip: Secondary | ICD-10-CM

## 2021-11-27 DIAGNOSIS — M545 Low back pain, unspecified: Secondary | ICD-10-CM

## 2021-11-27 MED ORDER — PREDNISONE 10 MG (21) PO TBPK
ORAL_TABLET | ORAL | 0 refills | Status: DC
Start: 2021-11-27 — End: 2022-06-02

## 2021-11-27 NOTE — Progress Notes (Signed)
Office Visit Note   Patient: Troy Gaines           Date of Birth: 1951/02/08           MRN: [id number] Visit Date: 11/27/2021              Requested by: Suzan Slick, MD [address] [place],  [place] [address] PCP: Suzan Slick, MD   Assessment & Plan: Visit Diagnoses:  1. Low back pain, unspecified back pain laterality, unspecified chronicity, unspecified whether sciatica present   2. Primary osteoarthritis of both hips     Plan: Impression is bilateral hip degenerative joint disease and multilevel degenerative disc disease lumbar spine.  I believe his symptoms today are coming more from his back rather than his hip joints.  We have discussed starting him on a steroid taper and sending him to physical therapy for this.  He will follow-up with [place] over the next 6 to 8 weeks if his symptoms have not improved.  Call with concerns or questions in the meantime.  Follow-Up Instructions: No follow-ups on file.   Orders:  Orders Placed This Encounter  Procedures   XR HIPS BILAT W OR W/O PELVIS 3-4 VIEWS   XR Lumbar Spine 2-3 Views   Ambulatory referral to Physical Therapy   Meds ordered this encounter  Medications   predniSONE (STERAPRED UNI-PAK 21 TAB) 10 MG (21) TBPK tablet    Sig: Take as directed    Dispense:  21 tablet    Refill:  0       Procedures: No procedures performed   Clinical Data: No additional findings.   Subjective: Chief Complaint  Patient presents with   Lower Back - Pain   Left Hip - Pain   Right Hip - Pain    HPI patient is a pleasant 70 year old gentleman who comes in today with lower back pain and bilateral hip pain.  The pain he has is primarily to the middle of the lower back and radiates to the lateral aspect on both sides.  He does note pain that goes into the groin and anterior thigh as well.  He was started to notice weakness in both legs.  Pain is worse going from a seated to standing position.  He denies any paresthesias  or bowel or bladder changes.  He does not take any over-the-counter medication for the pain.  Review of Systems as detailed in HPI.  All others reviewed and are negative.   Objective: Vital Signs: There were no vitals taken for this visit.  Physical Exam well-developed well-nourished gentleman in no acute distress.  Alert and oriented x3.  Ortho Exam lumbar spine exam shows no spinous or paraspinous tenderness.  He does have increased pain with lumbar flexion and extension.  Positive straight leg raise both sides.  No pain with logroll or FADIR, but he does have very little rotation specifically to the right hip..  No focal weakness.  He is neurovascular intact distally.  Specialty Comments:  No specialty comments available.  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   Hypertension 09/03/2012   Hyperlipidemia 09/03/2012   CKD (chronic kidney disease) stage 3, GFR 30-59 ml/min (HCC) 09/03/2012   History of gout 09/03/2012   Anemia -baseline hemoglobin unknown 09/03/2012   Dyspnea 09/03/2012   Tachycardia 09/03/2012   Past Medical History:  Diagnosis Date   Anemia -baseline hemoglobin unknown 09/03/2012   Arthritis    Asthma    COPD (chronic obstructive pulmonary disease) (HCC)    Gout    Hypercholesterolemia    Hypertension    Hyperuricemia    Pulmonary embolism (HCC)    bilat following tendon  repair   Renal disorder    Shortness of breath    Tremor     Family History  Problem Relation Age of Onset   Pancreatic cancer Mother    Cardiomyopathy Father    Tremor Father     Past Surgical History:  Procedure Laterality Date   achillis tendon     CARDIAC CATHETERIZATION     COLONOSCOPY WITH PROPOFOL N/A 04/09/2021   Procedure: COLONOSCOPY WITH PROPOFOL;  Surgeon: Dolores Frame, MD;  [facility];  Service: Gastroenterology;  Laterality: N/A;  AM   Social History   Occupational History   Not on file  Tobacco Use   Smoking status: Former    Types: Cigars    Quit date: 08/18/1991    Years since quitting: 30.2   Smokeless tobacco: Never  Substance and Sexual Activity   Alcohol use: Yes    Comment: occasional   Drug use: No   Sexual activity: Yes

## 2021-12-02 ENCOUNTER — Ambulatory Visit (INDEPENDENT_AMBULATORY_CARE_PROVIDER_SITE_OTHER): Payer: Medicare HMO | Admitting: Neurology

## 2021-12-02 DIAGNOSIS — G4733 Obstructive sleep apnea (adult) (pediatric): Secondary | ICD-10-CM | POA: Diagnosis not present

## 2021-12-02 DIAGNOSIS — R0683 Snoring: Secondary | ICD-10-CM

## 2021-12-02 DIAGNOSIS — G4719 Other hypersomnia: Secondary | ICD-10-CM

## 2021-12-03 NOTE — Progress Notes (Signed)
   [facility]  HOME SLEEP STUDY  STUDY DATE: 12/03/2021 PATIENT NAME: [name] DOB: 11-12-1951 MRN: [id number]  ORDERING CLINICIAN: Richard A. Sater, MD, PhD    CLINICAL INFORMATION: 70 year old man with snoring and excessive daytime sleepiness  FINDINGS:  Total Record Time: 7 hours 46 minutes Total Sleep Time:  7 hours 5 minutes  Percent REM:   25.1%   Calculated pAHI:  17.4/h   REM pAHI:    27.4/h     NREM pAHI: 14.5/h  Pulse Mean:    64 bpm Pulse Range (48 to 95 bpm)    Oxygen Sat% Mean: 94%  O2Sat Range (88 to 99% %)  O2Sat <88%: 0 minutes    IMPRESSION:  This home sleep study shows the following: Moderate OSA with a pAHI = 17.4/h Normal sleep efficiency   RECOMMENDATION: Recommend AutoPap with pressure settings of 5 to 20 cm H2O and a heated humidifier.  Download in 30 and 90 days Follow-up with Dr. Epimenio Foot   INTERPRETING PHYSICIAN:   Pearletha Furl. Epimenio Foot, MD, PhD, [facility] Certified in Neurology, Clinical Neurophysiology, Sleep Medicine, Pain Medicine and Neuroimaging  [facility] [address] [place], [place] [address] [phone]

## 2021-12-09 ENCOUNTER — Telehealth: Payer: Self-pay | Admitting: *Deleted

## 2021-12-09 ENCOUNTER — Encounter: Payer: Self-pay | Admitting: *Deleted

## 2021-12-09 DIAGNOSIS — G4719 Other hypersomnia: Secondary | ICD-10-CM

## 2021-12-09 DIAGNOSIS — R0683 Snoring: Secondary | ICD-10-CM

## 2021-12-09 DIAGNOSIS — G4733 Obstructive sleep apnea (adult) (pediatric): Secondary | ICD-10-CM

## 2021-12-09 NOTE — Telephone Encounter (Signed)
Per Dr. Epimenio Foot: "Please let him know that the home sleep study showed moderate sleep apnea and I recommend that he be treated with AutoPap 5 to 20 cm, heated humidifier and follow-up with me in 2-3 months "  I called pt, relayed results. He is agreeable to getting started on therapy. Aware we will send order to [facility]. Phone: [phone]. Fax: [phone]. Will also send him letter w/ info. Asked him to call back if he does not hear from them in the next couple weeks. Confirmed address/insurance on file.  I faxed order, received fax confirmation.

## 2022-01-14 NOTE — Telephone Encounter (Signed)
Reached out to [name]/[facility] to get update on pt.  Her response: "Troy Gaines - scheduled for 1/12, patient called to cancel 1/9, said would call back to reschedule.  Our CSR team have a task to contact him this Friday if we do not hear back from him."

## 2022-01-21 NOTE — Telephone Encounter (Signed)
Update from [name]/[facility] today: "Troy Gaines was scheduled for the 1/12, patient called 1/9 to cancel and stated would call back. I've requested he be called again today. I will also give you an update on him as well, hopefully, they can get them scheduled."

## 2022-01-28 NOTE — Telephone Encounter (Signed)
Update from [name] on 01/22/22: "Troy Gaines was scheduled for the 1/12, patient called 1/9 to cancel and stated would call back. I've requested he be called again today. I will also give you an update on him as well, hopefully, they can get them scheduled.  1/24: Patient returned call to CSR, stated he could not do it now and will call back in a couple of weeks. I'll give you and update when I see one."

## 2022-03-11 ENCOUNTER — Ambulatory Visit: Payer: Medicare HMO | Admitting: Family Medicine

## 2022-06-02 ENCOUNTER — Ambulatory Visit: Payer: Medicare HMO | Admitting: Internal Medicine

## 2022-06-02 ENCOUNTER — Encounter: Payer: Self-pay | Admitting: Internal Medicine

## 2022-06-02 VITALS — BP 130/74 | HR 77 | Temp 97.9°F | Ht 72.0 in | Wt 196.0 lb

## 2022-06-02 DIAGNOSIS — J4530 Mild persistent asthma, uncomplicated: Secondary | ICD-10-CM

## 2022-06-02 MED ORDER — FLUTICASONE FUROATE-VILANTEROL 100-25 MCG/ACT IN AEPB: 1.0000 | INHALATION_SPRAY | Freq: Every day | RESPIRATORY_TRACT | 3 refills | Status: DC

## 2022-06-02 NOTE — Progress Notes (Signed)
Troy Gaines    [id number]    [birth year]  Primary Care Physician:Rucker, Magdalen Spatz, MD  Referring Physician: Suzan Slick, MD [address] [place],  [place] [address] Reason for Consultation: chronic cough Date of Consultation: 06/02/2022  Chief complaint:   Chief Complaint  Patient presents with   Consult    Pt states he has had a persistent cough that will happen a lot if he is laughing and also can happen if he is talking a lot. States he has had it for a few weeks now.     HPI: Troy Gaines is a 71 y.o. man who presents for new patient evaluation for cough.  Cough symptoms started 5-6 months ago. He was seen by PCP in February and was felt to be secondary to Ace-I. Ramipril was stopped and he was switched to losartan. After a couple of months cough did not resolve and he was sent here for further evaluation.   Cough is usually with talking and worse with laughing. Usually a dry cough. Denies cough that wakes him up in his sleep. He also has shortness of breath with minimal exertion. He exercises with nordic poles at faster than 4 mph. But going up stairs and exerting more than this he gets more short of breath. Denies chest tightness and wheezing. Occasional right upper back pain. He denies recurrent pna or bronchitis. No childhood respiratory disease.   Neuro is concerned for sleep apnea - sleep study pending.   Social history:  Occupation: retired from [employer], lab work, Health and safety inspector work Exposures: originally from [place], been here for many years. Lives independently, ok with ADLs. Has had some chemical exposure in lab work Smoking history: former smoker of cigars, stopped in 1980s. Would do it keep awake. Mild passive smoke exposure in childhood    Social History   Occupational History   Not on file  Tobacco Use   Smoking status: Former    Types: Cigars    Quit date: 08/18/1991    Years since quitting: 30.8   Smokeless tobacco: Never   Substance and Sexual Activity   Alcohol use: Yes    Comment: occasional   Drug use: No   Sexual activity: Yes    Relevant family history:  Family History  Problem Relation Age of Onset   Pancreatic cancer Mother    Cardiomyopathy Father    Tremor Father     Past Medical History:  Diagnosis Date   Anemia -baseline hemoglobin unknown 09/03/2012   Arthritis    Asthma    COPD (chronic obstructive pulmonary disease) (HCC)    Gout    Hypercholesterolemia    Hypertension    Hyperuricemia    Pulmonary embolism (HCC)    bilat following tendon repair   Renal disorder    Shortness of breath    Tremor     Past Surgical History:  Procedure Laterality Date   achillis tendon     CARDIAC CATHETERIZATION     COLONOSCOPY WITH PROPOFOL N/A 04/09/2021   Procedure: COLONOSCOPY WITH PROPOFOL;  Surgeon: Dolores Frame, MD;  [facility];  Service: Gastroenterology;  Laterality: N/A;  AM     Physical Exam: Blood pressure 130/74, pulse 77, temperature 97.9 F (36.6 C), temperature source Oral, height 6' (1.829 m), weight 196 lb (88.9 kg), SpO2 98 %. Gen:      No acute distress ENT:  no nasal polyps, mucus membranes moist  Lungs:    No increased respiratory effort, symmetric chest wall excursion, clear to auscultation bilaterally, no wheezes or crackles CV:         Regular rate and rhythm; no murmurs, rubs, or gallops.  No pedal edema Abd:      + bowel sounds; soft, non-tender; no distension MSK: no acute synovitis of DIP or PIP joints, no mechanics hands.  Skin:      Warm and dry; no rashes Neuro: normal speech, no focal facial asymmetry Psych: alert and oriented x3, normal mood and affect   Data Reviewed/Medical Decision Making:  Independent interpretation of tests: Imaging:  Review of patient's chest xray images 2022 revealed hyperinflation. The patient's images have been independently reviewed by me.    PFTs: I have personally reviewed the patient's PFTs  and 2021 show moderate airflow limitation with positive BD response.     Latest Ref Rng & Units 11/16/2020    9:40 AM  PFT Results  FVC-Pre L 3.90    FVC-Predicted Pre % 92    FVC-Post L 4.05    FVC-Predicted Post % 96    Pre FEV1/FVC % % 55    Post FEV1/FCV % % 61    FEV1-Pre L 2.13    FEV1-Predicted Pre % 66    FEV1-Post L 2.46    DLCO uncorrected ml/min/mmHg 27.89    DLCO UNC% % 99    DLVA Predicted % 110    TLC L 6.71    TLC % Predicted % 90    RV % Predicted % 103      Labs:  Lab Results  Component Value Date   WBC 8.7 09/04/2012   HGB 11.4 (L) 09/04/2012   HCT 33.7 (L) 09/04/2012   MCV 80.0 09/04/2012   PLT 231 09/04/2012   Lab Results  Component Value Date   NA 139 09/04/2012   K 3.9 09/04/2012   CL 105 09/04/2012   CO2 23 09/04/2012     Immunization status:  Immunization History  Administered Date(s) Administered   Influenza,inj,Quad PF,6+ Mos 10/17/2020, 10/21/2021   Moderna Sars-Covid-2 Vaccination 02/14/2020, 03/13/2020, 11/20/2020   Pneumococcal Conjugate-13 10/12/2018   Pneumococcal Polysaccharide-23 08/22/2020   Tdap 09/01/2015   Zoster Recombinat (Shingrix) 11/03/2019, 05/26/2020     I reviewed prior external note(s) from primary care  I reviewed the result(s) of the labs and imaging as noted above.   I have ordered    Assessment:  Possible asthma, new diagnosis Previous remote tobacco use  Plan/Recommendations: I do not think his intermittent previous cigar use would be enough to qualify for COPD but could predispose him to asthma. PFT show airflow limitation with bd responsiveness.  Start ICS-LABA with Breo once daily.  Continue prn albuterol, encouraged use.   We discussed disease management and progression at length today.     Return to Care: Return in about 8 weeks (around 07/28/2022).  Durel Salts, MD Pulmonary and Critical Care Medicine [facility] [phone]  CC: Suzan Slick, MD

## 2022-06-02 NOTE — Progress Notes (Signed)
The patient has been prescribed the inhaler Breo. Inhaler technique was demonstrated to patient. The patient subsequently demonstrated correct technique.   

## 2022-06-02 NOTE — Patient Instructions (Signed)
Please schedule follow up scheduled with myself in 2 months.  If my schedule is not open yet, we will contact you with a reminder closer to that time. Please call [phone] if you haven't heard from [place] a month before.   I think your symptoms could be related to asthma based your breathing testing and your chest xrays. Let's try treating for you for asthma and see how you do.   Start Breo 1 puff once a day. Gargle after use.   Take the albuterol rescue inhaler every 4 to 6 hours as needed for wheezing or shortness of breath. You can also take it 15 minutes before exercise or exertional activity. Side effects include heart racing or pounding, jitters or anxiety. If you have a history of an irregular heart rhythm, it can make this worse. Can also give some patients a hard time sleeping.

## 2022-06-24 ENCOUNTER — Encounter: Payer: Self-pay | Admitting: Cardiology

## 2022-06-24 ENCOUNTER — Encounter: Payer: Self-pay | Admitting: *Deleted

## 2022-06-24 ENCOUNTER — Ambulatory Visit: Payer: Medicare HMO | Admitting: Cardiology

## 2022-06-24 VITALS — BP 136/84 | HR 65 | Wt 195.3 lb

## 2022-06-24 DIAGNOSIS — I10 Essential (primary) hypertension: Secondary | ICD-10-CM

## 2022-06-24 DIAGNOSIS — R0602 Shortness of breath: Secondary | ICD-10-CM | POA: Diagnosis not present

## 2022-06-24 DIAGNOSIS — Z86711 Personal history of pulmonary embolism: Secondary | ICD-10-CM | POA: Diagnosis not present

## 2022-06-24 DIAGNOSIS — E782 Mixed hyperlipidemia: Secondary | ICD-10-CM | POA: Diagnosis not present

## 2022-07-08 ENCOUNTER — Ambulatory Visit (HOSPITAL_BASED_OUTPATIENT_CLINIC_OR_DEPARTMENT_OTHER)
Admission: RE | Admit: 2022-07-08 | Discharge: 2022-07-08 | Disposition: A | Discharge status: Home / Self Care | Payer: Medicare HMO | Source: Ambulatory Visit | Attending: Cardiology | Admitting: Cardiology

## 2022-07-08 ENCOUNTER — Ambulatory Visit (HOSPITAL_COMMUNITY)
Admission: RE | Admit: 2022-07-08 | Discharge: 2022-07-08 | Disposition: A | Discharge status: Home / Self Care | Payer: Medicare HMO | Source: Ambulatory Visit | Attending: Cardiology | Admitting: Cardiology

## 2022-07-08 DIAGNOSIS — R0602 Shortness of breath: Secondary | ICD-10-CM | POA: Diagnosis not present

## 2022-07-08 LAB — NM MYOCAR MULTI W/SPECT W/WALL MOTION / EF
LV dias vol: 108 mL (ref 62–150)
LV sys vol: 47 mL
Nuc Stress EF: 57 %
Peak HR: 102 {beats}/min
RATE: 0.3
Rest HR: 68 {beats}/min
SDS: 0
SRS: 1
SSS: 1
ST Depression (mm): 0 mm
TID: 1.06

## 2022-07-08 LAB — ECHOCARDIOGRAM COMPLETE
Area-P 1/2: 3.21 cm2
S' Lateral: 2.7 cm

## 2022-07-08 MED ORDER — TECHNETIUM TC 99M TETROFOSMIN IV KIT
30.0000 | PACK | Freq: Once | INTRAVENOUS | Status: AC | PRN
  Administered 2022-07-08: 30 via INTRAVENOUS

## 2022-07-08 MED ORDER — SODIUM CHLORIDE FLUSH 0.9 % IV SOLN
INTRAVENOUS | Status: AC
  Administered 2022-07-08: 10 mL via INTRAVENOUS
  Filled 2022-07-08: qty 10

## 2022-07-08 MED ORDER — TECHNETIUM TC 99M TETROFOSMIN IV KIT
10.0000 | PACK | Freq: Once | INTRAVENOUS | Status: AC | PRN
  Administered 2022-07-08: 9.4 via INTRAVENOUS

## 2022-07-08 MED ORDER — REGADENOSON 0.4 MG/5ML IV SOLN
INTRAVENOUS | Status: AC
  Administered 2022-07-08: 0.4 mg via INTRAVENOUS
  Filled 2022-07-08: qty 5

## 2022-07-08 NOTE — Progress Notes (Signed)
*  PRELIMINARY RESULTS* Echocardiogram 2D Echocardiogram has been performed.  [name] 07/08/2022, 11:10 AM

## 2022-08-06 ENCOUNTER — Ambulatory Visit: Payer: Medicare HMO | Admitting: Internal Medicine

## 2022-12-18 ENCOUNTER — Ambulatory Visit: Payer: Medicare HMO | Admitting: Neurology

## 2022-12-18 ENCOUNTER — Encounter: Payer: Self-pay | Admitting: Neurology

## 2022-12-18 VITALS — BP 151/96 | HR 76 | Ht 72.0 in | Wt 198.5 lb

## 2022-12-18 DIAGNOSIS — G250 Essential tremor: Secondary | ICD-10-CM | POA: Diagnosis not present

## 2022-12-18 DIAGNOSIS — G4733 Obstructive sleep apnea (adult) (pediatric): Secondary | ICD-10-CM | POA: Diagnosis not present

## 2022-12-18 NOTE — Progress Notes (Signed)
[facility]  PATIENT: Troy Gaines DOB: 1951/04/19  REFERRING DOCTOR OR PCP: Sundra Aland, MD SOURCE: Patient, notes from primary care  _________________________________   HISTORICAL  CHIEF COMPLAINT:  Chief Complaint  Patient presents with   Follow-up    Pt in room #10 and alone. Pt here today to discuss his sleep apnea.    HISTORY OF PRESENT ILLNESS:  Drucie Ip, at [facility] for neurologic consultation regarding his fatigue and poor sleep.  Update 12/18/2022 He is a 71 year old man who was reporting fatigue and sleepiness.   He sometimes falls asleep when reading or watching TV.   He has a strong FH of OSA (father, mother, brother and one sister)      We did a sleep study December 2022.  It showed moderate OSA with an AHI 17.4.  We had tried to set him up for AutoPap.  He had canceled the appointment with [facility] home services  He denies insomnia.Marland Kitchen  He lives alone and is not sure how loud his snoring is or if he has any pauses, gasping or snorting.  He wakes up 0-3 times a night sometimes to urinate.  He will quickly fall back asleep.   He does not feel refreshed in morning.  He sometimes has a headache upon awakening.   His mouth is often dry  He also has essential tremor.  It increases when he holds something or writes.   He is on metoprolol.   He is not sure it is helping the tremor much   The tremor is predominantly on the left and he is right-handed so it has not affected his handwriting or other fine motor tasks.  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    He has essential hypertension. And hyperlipidemia.  .   He has some back and hip pain.    EPWORTH SLEEPINESS SCALE  On a scale of 0 - 3 what is the chance of dozing:  Sitting and Reading:   1 Watching TV:    1 Sitting inactive in a public place: 1 Passenger in car for one hour: 0 Lying down to rest  in the afternoon: 3 Sitting and talking to someone: 0 Sitting quietly after lunch:  1 In a car, stopped in traffic:  0  Total (out of 24):     7/24   normal sleepiness  HST 12/02/2021 This home sleep study shows the following: Moderate OSA with a pAHI = 17.4/h Normal sleep efficiency     RECOMMENDATION: Recommend AutoPap with pressure settings of 5 to 20 cm H2O and a heated humidifier.  Download in 30 and 90 days Follow-up with Dr. Epimenio Foot    REVIEW OF SYSTEMS: Constitutional: No fevers, chills, sweats, or change in appetite.  He has fatigue and sleepiness Eyes: No visual changes, double vision, eye pain Ear, nose and throat: No hearing loss, ear pain, nasal congestion, sore throat Cardiovascular: No chest pain, palpitations Respiratory:  No shortness of breath at rest or with exertion.   No wheezes GastrointestinaI: No nausea, vomiting, diarrhea, abdominal pain, fecal incontinence Genitourinary: He has urinary hesitancy.  Chronic kidney disease. Musculoskeletal: He has hip and back pain Integumentary: No rash, pruritus, skin lesions Neurological: as above Psychiatric: No depression at this time.  No anxiety Endocrine: No palpitations, diaphoresis, change in appetite, change in weigh or increased thirst Hematologic/Lymphatic:  No anemia, purpura, petechiae.  He is on Xarelto  for pulmonary embolus Allergic/Immunologic: No itchy/runny eyes, nasal congestion, recent allergic reactions, rashes  ALLERGIES: No Known Allergies  HOME MEDICATIONS:  Current Outpatient Medications:    albuterol (PROVENTIL HFA;VENTOLIN HFA) 108 (90 Base) MCG/ACT inhaler, Inhale 2 puffs into the lungs every 6 (six) hours as needed for wheezing or shortness of breath., Disp: , Rfl: 0   B Complex-Folic Acid (B COMPLEX-VITAMIN B12 PO), Take 1 tablet by mouth 3 (three) times a week., Disp: , Rfl:    calcium-vitamin D (OSCAL WITH D) 500-5 MG-MCG tablet, Take 1 tablet by mouth., Disp: , Rfl:    etodolac  (LODINE) 300 MG capsule, Take by mouth., Disp: , Rfl:    fluticasone furoate-vilanterol (BREO ELLIPTA) 100-25 MCG/ACT AEPB, Inhale 1 puff into the lungs daily., Disp: 1 each, Rfl: 3   hydrocortisone cream 1 %, Apply 1 application topically daily as needed for itching., Disp: , Rfl:    metoprolol succinate (TOPROL-XL) 100 MG 24 hr tablet, Take 100 mg by mouth daily. Take with or immediately following a meal., Disp: , Rfl:    Multiple Vitamins-Minerals (LUTEIN-ZEAXANTHIN) TABS, Take 1 tablet by mouth daily., Disp: , Rfl:    Tamsulosin HCl (FLOMAX) 0.4 MG CAPS, Take 0.4 mg by mouth at bedtime., Disp: , Rfl:    XARELTO 20 MG TABS tablet, Take 20 mg by mouth at bedtime., Disp: , Rfl: 1   ramipril (ALTACE) 5 MG capsule, Take 1 capsule (5 mg total) by mouth daily., Disp: 90 capsule, Rfl: 3  PAST MEDICAL HISTORY: Past Medical History:  Diagnosis Date   Arthritis    Asthma    COPD (chronic obstructive pulmonary disease) (HCC)    Essential hypertension    Gout    Mixed hyperlipidemia    Pulmonary embolism (HCC)    2013 postoperative and recurrent 2019 - chronic anticoagulation   Tremor     PAST SURGICAL HISTORY: Past Surgical History:  Procedure Laterality Date   ACHILLES TENDON REPAIR     CARDIAC CATHETERIZATION     COLONOSCOPY WITH PROPOFOL N/A 04/09/2021   Procedure: COLONOSCOPY WITH PROPOFOL;  Surgeon: Dolores Frame, MD;  [facility];  Service: Gastroenterology;  Laterality: N/A;  AM    FAMILY HISTORY: Family History  Problem Relation Age of Onset   Pancreatic cancer Mother    Cardiomyopathy Father    Tremor Father     SOCIAL HISTORY:  Social History   Socioeconomic History   Marital status: Single    Spouse name: Not on file   Number of children: Not on file   Years of education: Not on file   Highest education level: Not on file  Occupational History   Not on file  Tobacco Use   Smoking status: Former    Types: Cigars    Quit date: 08/18/1991     Years since quitting: 31.3   Smokeless tobacco: Never  Substance and Sexual Activity   Alcohol use: Yes    Comment: occasional   Drug use: No   Sexual activity: Yes  Other Topics Concern   Not on file  Social History Narrative   Not on file   Social Determinants of Health   Financial Resource Strain: Not on file  Food Insecurity: Not on file  Transportation Needs: Not on file  Physical Activity: Not on file  Stress: Not on file  Social Connections: Not on file  Intimate Partner Violence: Not on file     PHYSICAL EXAM  Vitals:   12/18/22 5462  BP: (!) 151/96  Pulse: 76  Weight: 198 lb 8 oz (90 kg)  Height: 6' (1.829 m)    Body mass index is 26.92 kg/m.   General: The patient is well-developed and well-nourished and in no acute distress  Neurologic Exam  Mental status: The patient is alert and oriented x 3 at the time of the examination. The patient has apparent normal recent and remote memory, with an apparently normal attention span and concentration ability.   Speech is normal.  Cranial nerves: Extraocular movements are full.  Facial strength is normal. No obvious hearing deficits are noted.  Motor:  Very mild left hand tremor with intention.  Muscle bulk is normal.   Tone is normal. Strength is  5 / 5 in all 4 extremities.   Sensory: Sensory testing is intact to pinprick, soft touch and vibration sensation in all 4 extremities.  Coordination: Cerebellar testing reveals good finger-nose-finger and heel-to-shin bilaterally.  Gait and station: Station is normal.   Gait is normal. Tandem gait is normal. Romberg is negative.   Reflexes: Deep tendon reflexes are symmetric and normal bilaterally.         ASSESSMENT AND PLAN  OSA (obstructive sleep apnea)  Benign essential tremor   The home sleep had shown moderate sleep apnea.  We can set up AutoPap 5-15 cm Follow-up in 3 months.   Fradel Baldonado A. Epimenio Foot, MD, [facility] 12/18/2022, 8:08 PM Certified in  Neurology, Clinical Neurophysiology, Sleep Medicine and Neuroimaging  [facility] [address] [place], [place] [address] [phone]

## 2022-12-23 ENCOUNTER — Telehealth: Payer: Self-pay | Admitting: *Deleted

## 2022-12-23 ENCOUNTER — Other Ambulatory Visit: Payer: Self-pay | Admitting: *Deleted

## 2022-12-23 DIAGNOSIS — G4733 Obstructive sleep apnea (adult) (pediatric): Secondary | ICD-10-CM

## 2022-12-23 NOTE — Progress Notes (Signed)
Resent CPAP order to [facility] at [phone]. Received fax confirmation.

## 2022-12-23 NOTE — Telephone Encounter (Signed)
Please call pt to schedule initial cpap f/u around end of March 2024. Thank you

## 2023-01-26 ENCOUNTER — Other Ambulatory Visit: Payer: Self-pay | Admitting: *Deleted

## 2023-01-26 ENCOUNTER — Encounter: Payer: Self-pay | Admitting: Neurology

## 2023-01-26 DIAGNOSIS — G4733 Obstructive sleep apnea (adult) (pediatric): Secondary | ICD-10-CM

## 2023-03-10 NOTE — Progress Notes (Unsigned)
Virtual Visit via Video Note  I connected with Troy Gaines on 03/10/23 at  4:00 PM EDT by a video enabled telemedicine application and verified that I am speaking with the correct person using two identifiers.  Location: Patient: in his car  Provider: in the office    I discussed the limitations of evaluation and management by telemedicine and the availability of in person appointments. The patient expressed understanding and agreed to proceed.  History of Present Illness: Today, March 11, 2023 SS: Saw Dr. Felecia Shelling December 2023 HST showed moderate sleep apnea.  This is his initial CPAP visit. Setup date 12/23/22. Pressure was reduced from 5-15 down to 5-12 due to report of chest pain. Using half face mask under the nose and around the mouth. Still feels getting large amount of pressure. Occasionally wakes up at night with headache. When he wakes he feels better more restorative sleep.     Update 12/18/2022 He is a 72 year old man who was reporting fatigue and sleepiness.   He sometimes falls asleep when reading or watching TV.   He has a strong FH of OSA (father, mother, brother and one sister)      We did a sleep study December 2022.  It showed moderate OSA with an AHI 17.4.  We had tried to set him up for AutoPap.  He had canceled the appointment with [facility]   He denies insomnia.Marland Kitchen  He lives alone and is not sure how loud his snoring is or if he has any pauses, gasping or snorting.  He wakes up 0-3 times a night sometimes to urinate.  He will quickly fall back asleep.   He does not feel refreshed in morning.  He sometimes has a headache upon awakening.   His mouth is often dry   He also has essential tremor.  It increases when he holds something or writes.   He is on metoprolol.   He is not sure it is helping the tremor much   The tremor is predominantly on the left and he is right-handed so it has not affected his handwriting or other fine motor tasks.   He is on  Xarelto for bilateral PE.  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     He has essential hypertension. And hyperlipidemia.  .   He has some back and hip pain.     EPWORTH SLEEPINESS SCALE   On a scale of 0 - 3 what is the chance of dozing:   Sitting and Reading:                           1 Watching TV:                                      1 Sitting inactive in a public place:        1 Passenger in car for one hour:           0 Lying down to rest in the afternoon:   3 Sitting and talking to someone:          0 Sitting quietly after lunch:                   1 In a car, stopped in traffic:  0   Total (out of 24):     7/24   normal sleepiness   HST 12/02/2021 This home sleep study shows the following: Moderate OSA with a pAHI = 17.4/h Normal sleep efficiency     Observations/Objective: Via virtual visit is alert and oriented, speech is clear and concise, facial symmetry noted  Assessment and Plan: 1.  OSA on CPAP -Reduce pressure settings 5-11 cmH2O for comfort -Order mask refit, leak 25 -Reach out in 1 month and pull CPAP download, otherwise follow-up in 6 months virtually, encouraged to continue nightly use for greater than 4 hours  Follow Up Instructions: 6 months   I discussed the assessment and treatment plan with the patient. The patient was provided an opportunity to ask questions and all were answered. The patient agreed with the plan and demonstrated an understanding of the instructions.   The patient was advised to call back or seek an in-person evaluation if the symptoms worsen or if the condition fails to improve as anticipated.  Evangeline Dakin, DNP  [facility] [address], [place], [place] [address] [phone]

## 2023-03-11 ENCOUNTER — Telehealth: Payer: Medicare HMO | Admitting: Neurology

## 2023-03-11 DIAGNOSIS — G4733 Obstructive sleep apnea (adult) (pediatric): Secondary | ICD-10-CM

## 2023-03-12 ENCOUNTER — Telehealth: Payer: Self-pay

## 2023-03-12 NOTE — Telephone Encounter (Signed)
Orders faxed to [facility]

## 2023-03-21 ENCOUNTER — Encounter: Payer: Self-pay | Admitting: Neurology

## 2023-04-16 ENCOUNTER — Encounter: Payer: Self-pay | Admitting: Neurology

## 2023-04-20 ENCOUNTER — Ambulatory Visit: Payer: Medicare HMO | Admitting: Neurology

## 2023-05-27 NOTE — Progress Notes (Addendum)
[facility]  PATIENT: Troy Gaines DOB: 1951-04-19  REFERRING DOCTOR OR PCP: Sundra Aland, MD SOURCE: Patient, notes from primary care  _________________________________   HISTORICAL  CHIEF COMPLAINT:  Chief Complaint  Patient presents with   Consult    Pt in room 10 here for consult for lightheadedness from [facility]. Pt said several weeks ago felt lightheaded comes and goes saw PCP. Pt said this last week has improved, happens more so on empty stomach. No blood pressure issues. Pt on cpap, report in chart, pt does reports some congestion with cpap reports pressure has been reduced. Pt blood pressure elevated x2.     HISTORY OF PRESENT ILLNESS:  Troy Gaines, at [facility] for neurologic consultation regarding his fatigue and poor sleep.  Update 05/28/2023 He is being referred back to [place] for lightheadedness.   Tis has been present the last 2 months.   He denies vertigo.   This usually occurs with standing and only rarely while sitting.  He notes symptoms can be random while standingand not necessarily occurring shortly after standing from a seated or lying position.     He feels lightheaded x 5-10 seconds at a time anywhere from 1-3 times a day   He does nt feel he will pass out.   No other symptoms during the episode - no numbness, tingling, HA, nausea.     He is on ramipril and metoprolol for HTN and tremor.    He also notes independent mild pressure sensation in the head that is fleeting just seconds at a time.  These are not common and do not occur associated with lightheadedness  His BP is mildly elevated today .  He is on ramipril and dose was increased.     He also has essential tremor.  It increases when he holds something or writes.   He is on metoprolol.   He is not sure it is helping the tremor much   The tremor is predominantly on the left and he is right-handed so it has not affected his handwriting or other  fine motor tasks.  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    He has essential hypertension. And hyperlipidemia.  .     OSA history: He was reporting fatigue and sleepiness.   He sometimes falls asleep when reading or watching TV.   He has a strong FH of OSA (father, mother, brother and one sister)      We did a sleep study December 2022.  It showed moderate OSA with an AHI 17.4.  He started AutoPap.  He is currently doing well.  Download shows 77% compliance.  AHI is excellent at only 1.6/h  He denies insomnia.Marland Kitchen  He lives alone and is not sure how loud his snoring is or if he has any pauses, gasping or snorting.  He wakes up 0-3 times a night sometimes to urinate.  He will quickly fall back asleep.   He does not feel refreshed in morning.  He sometimes has a headache upon awakening.   His mouth is often dry  EPWORTH SLEEPINESS SCALE  On a scale of 0 - 3 what is the chance of dozing:  Sitting and Reading:   1 Watching TV:    1 Sitting inactive in a public place: 1 Passenger in car for one hour: 0 Lying down to rest in the afternoon: 3 Sitting and talking to someone:  0 Sitting quietly after lunch:  1 In a car, stopped in traffic:  0  Total (out of 24):     7/24   normal sleepiness  HST 12/02/2021 This home sleep study shows the following: Moderate OSA with a pAHI = 17.4/h Normal sleep efficiency   Labs: 03/31/2023:  CMP and lipid panel unremarkable   REVIEW OF SYSTEMS: Constitutional: No fevers, chills, sweats, or change in appetite.  He has fatigue and sleepiness Eyes: No visual changes, double vision, eye pain Ear, nose and throat: No hearing loss, ear pain, nasal congestion, sore throat Cardiovascular: No chest pain, palpitations Respiratory:  No shortness of breath at rest or with exertion.   No wheezes GastrointestinaI: No nausea, vomiting, diarrhea, abdominal pain, fecal incontinence Genitourinary: He has urinary  hesitancy.  Chronic kidney disease. Musculoskeletal: He has hip and back pain Integumentary: No rash, pruritus, skin lesions Neurological: as above Psychiatric: No depression at this time.  No anxiety Endocrine: No palpitations, diaphoresis, change in appetite, change in weigh or increased thirst Hematologic/Lymphatic:  No anemia, purpura, petechiae.  He is on Xarelto for pulmonary embolus Allergic/Immunologic: No itchy/runny eyes, nasal congestion, recent allergic reactions, rashes  ALLERGIES: No Known Allergies  HOME MEDICATIONS:  Current Outpatient Medications:    albuterol (PROVENTIL HFA;VENTOLIN HFA) 108 (90 Base) MCG/ACT inhaler, Inhale 2 puffs into the lungs every 6 (six) hours as needed for wheezing or shortness of breath., Disp: , Rfl: 0   allopurinol (ZYLOPRIM) 100 MG tablet, Take 100 mg by mouth daily., Disp: , Rfl:    B Complex-Folic Acid (B COMPLEX-VITAMIN B12 PO), Take 1 tablet by mouth 3 (three) times a week., Disp: , Rfl:    cholecalciferol (VITAMIN D3) 25 MCG (1000 UNIT) tablet, Take 1,000 Units by mouth daily., Disp: , Rfl:    hydrocortisone cream 1 %, Apply 1 application topically daily as needed for itching., Disp: , Rfl:    metoprolol succinate (TOPROL-XL) 100 MG 24 hr tablet, Take 100 mg by mouth daily. Take with or immediately following a meal., Disp: , Rfl:    Multiple Vitamins-Minerals (LUTEIN-ZEAXANTHIN) TABS, Take 1 tablet by mouth daily., Disp: , Rfl:    Tamsulosin HCl (FLOMAX) 0.4 MG CAPS, Take 0.4 mg by mouth at bedtime., Disp: , Rfl:    XARELTO 20 MG TABS tablet, Take 20 mg by mouth at bedtime., Disp: , Rfl: 1   calcium-vitamin D (OSCAL WITH D) 500-5 MG-MCG tablet, Take 1 tablet by mouth., Disp: , Rfl:    etodolac (LODINE) 300 MG capsule, Take by mouth. (Patient not taking: Reported on 05/28/2023), Disp: , Rfl:    fluticasone furoate-vilanterol (BREO ELLIPTA) 100-25 MCG/ACT AEPB, Inhale 1 puff into the lungs daily., Disp: 1 each, Rfl: 3   ramipril (ALTACE) 5  MG capsule, Take 1 capsule (5 mg total) by mouth daily. (Patient taking differently: Take 10 mg by mouth daily.), Disp: 90 capsule, Rfl: 3  PAST MEDICAL HISTORY: Past Medical History:  Diagnosis Date   Arthritis    Asthma    COPD (chronic obstructive pulmonary disease) (HCC)    Essential hypertension    Gout    Mixed hyperlipidemia    Pulmonary embolism (HCC)    2013 postoperative and recurrent 2019 - chronic anticoagulation   Tremor     PAST SURGICAL HISTORY: Past Surgical History:  Procedure Laterality Date   ACHILLES TENDON REPAIR     CARDIAC CATHETERIZATION     COLONOSCOPY WITH PROPOFOL N/A 04/09/2021   Procedure: COLONOSCOPY WITH PROPOFOL;  Surgeon: Levon Hedger  Alisia Ferrari, MD;  [facility]: [facility];  Service: Gastroenterology;  Laterality: N/A;  AM    FAMILY HISTORY: Family History  Problem Relation Age of Onset   Pancreatic cancer Mother    Cardiomyopathy Father    Tremor Father     SOCIAL HISTORY:  Social History   Socioeconomic History   Marital status: Single    Spouse name: Not on file   Number of children: Not on file   Years of education: Not on file   Highest education level: Not on file  Occupational History   Not on file  Tobacco Use   Smoking status: Former    Types: Cigars    Quit date: 08/18/1991    Years since quitting: 31.7   Smokeless tobacco: Never  Substance and Sexual Activity   Alcohol use: Yes    Comment: occasional   Drug use: No   Sexual activity: Yes  Other Topics Concern   Not on file  Social History Narrative   Not on file   Social Determinants of Health   Financial Resource Strain: Not on file  Food Insecurity: Not on file  Transportation Needs: Not on file  Physical Activity: Not on file  Stress: Not on file  Social Connections: Not on file  Intimate Partner Violence: Not on file     PHYSICAL EXAM  Vitals:   05/28/23 0936 05/28/23 0944  BP: (!) 146/87 (!) 145/84  Pulse: 70 70  Weight: 198 lb 12.8 oz  (90.2 kg)   Height: 6' (1.829 m)    Vitals:   05/28/23 0936 05/28/23 0944  BP: (!) 146/87 (!) 145/84  Pulse: 70 70    ORTHOSTATIC Laying down   148/88  72 Sitting          145/90    72 Standing 30 s    145/90   76 Standing 90 s     145/88   80   Body mass index is 26.96 kg/m.   General: The patient is well-developed and well-nourished and in no acute distress  Neurologic Exam  Mental status: The patient is alert and oriented x 3 at the time of the examination. The patient has apparent normal recent and remote memory, with an apparently normal attention span and concentration ability.   Speech is normal.  Cranial nerves: Extraocular movements are full.  Facial strength is normal. No obvious hearing deficits are noted.  Motor:  Very mild left hand tremor with intention.  Muscle bulk is normal.   Tone is normal. Strength is  5 / 5 in all 4 extremities.   Sensory: Sensory testing is intact to pinprick, soft touch and vibration sensation in all 4 extremities.  Coordination: Cerebellar testing reveals good finger-nose-finger and heel-to-shin bilaterally.  Gait and station: Station is normal.   Gait is normal. Tandem gait is normal. Romberg is negative  Reflexes: Deep tendon reflexes are symmetric and normal bilaterally.         ASSESSMENT AND PLAN  Intermittent lightheadedness  OSA (obstructive sleep apnea)  Benign essential tremor   Etiology of dizzy spells uncertain.  Did not have orthostatic change in VS.  As only 5-10 sec x 1-3 times a day , we will hold off on any empiric treatment.   If worsen, consider change in HTN med's.    If episodes significantly increase in intensity or frequency then check carotid doppler and a Holter monitor Continue metoprolol for tremor but consider holding if increasing spells. Follow up 12  months   Venie Montesinos A. Epimenio Foot, MD, [facility] 05/28/2023, 10:11 AM Certified in Neurology, Clinical Neurophysiology, Sleep Medicine and  Neuroimaging  [facility] [address] [place], [place] [address] [phone]

## 2023-05-28 ENCOUNTER — Ambulatory Visit: Payer: Medicare HMO | Admitting: Neurology

## 2023-05-28 ENCOUNTER — Encounter: Payer: Self-pay | Admitting: Neurology

## 2023-05-28 VITALS — BP 145/84 | HR 70 | Ht 72.0 in | Wt 198.8 lb

## 2023-05-28 DIAGNOSIS — R42 Dizziness and giddiness: Secondary | ICD-10-CM | POA: Diagnosis not present

## 2023-05-28 DIAGNOSIS — G250 Essential tremor: Secondary | ICD-10-CM

## 2023-05-28 DIAGNOSIS — G4733 Obstructive sleep apnea (adult) (pediatric): Secondary | ICD-10-CM

## 2023-05-29 NOTE — Addendum Note (Signed)
Addended by: Despina Arias A on: 05/29/2023 03:01 PM   Modules accepted: Orders

## 2023-09-29 NOTE — Progress Notes (Unsigned)
Virtual Visit via Video Note  I connected with Troy Gaines on 09/30/23 at  4:00 PM EDT by a video enabled telemedicine application and verified that I am speaking with the correct person using two identifiers.  Location: Patient: in his car  Provider: in the office    I discussed the limitations of evaluation and management by telemedicine and the availability of in person appointments. The patient expressed understanding and agreed to proceed.  History of Present Illness: September 30, 2023 SS: Here via VV. AHI 1.1, leak 10.0, 5-11 cm water. 89% compliance out of 90 nights, > 4 hours 76%. We reduced pressure down to 5-11 cm to tolerability, has been better. Sleep has been restorative. Using FFM. Adjusted to using nightly.   Today, March 11, 2023 SS: Saw Dr. Epimenio Foot December 2023 HST showed moderate sleep apnea.  This is his initial CPAP visit. Setup date 12/23/22. Pressure was reduced from 5-15 down to 5-12 due to report of chest pain. Using half face mask under the nose and around the mouth. Still feels getting large amount of pressure. Occasionally wakes up at night with headache. When he wakes he feels better more restorative sleep.     Update 12/18/2022 He is a 72 year old man who was reporting fatigue and sleepiness.   He sometimes falls asleep when reading or watching TV.   He has a strong FH of OSA (father, mother, brother and one sister)      We did a sleep study December 2022.  It showed moderate OSA with an AHI 17.4.  We had tried to set him up for AutoPap.  He had canceled the appointment with [facility] home [facility]   He denies insomnia.Marland Kitchen  He lives alone and is not sure how loud his snoring is or if he has any pauses, gasping or snorting.  He wakes up 0-3 times a night sometimes to urinate.  He will quickly fall back asleep.   He does not feel refreshed in morning.  He sometimes has a headache upon awakening.   His mouth is often dry   He also has essential tremor.  It  increases when he holds something or writes.   He is on metoprolol.   He is not sure it is helping the tremor much   The tremor is predominantly on the left and he is right-handed so it has not affected his handwriting or other fine motor tasks.   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     He has essential hypertension. And hyperlipidemia.  .   He has some back and hip pain.     EPWORTH SLEEPINESS SCALE   On a scale of 0 - 3 what is the chance of dozing:   Sitting and Reading:                           1 Watching TV:                                      1 Sitting inactive in a public place:        1 Passenger in car for one hour:           0 Lying down to rest in the afternoon:   3 Sitting and talking to someone:  0 Sitting quietly after lunch:                   1 In a car, stopped in traffic:                  0   Total (out of 24):     7/24   normal sleepiness   HST 12/02/2021 This home sleep study shows the following: Moderate OSA with a pAHI = 17.4/h Normal sleep efficiency     Observations/Objective: Via virtual visit is alert and oriented, speech is clear and concise, facial symmetry noted  Assessment and Plan: 1.  OSA on CPAP  -Continue nightly CPAP usage.  Recommend minimum of 4 hours nightly utilization.  Continue current settings.  Will send an order to DME to send supplies as needed.  Follow Up Instructions: 1 year    I discussed the assessment and treatment plan with the patient. The patient was provided an opportunity to ask questions and all were answered. The patient agreed with the plan and demonstrated an understanding of the instructions.   The patient was advised to call back or seek an in-person evaluation if the symptoms worsen or if the condition fails to improve as anticipated.  Otila Kluver, DNP  [facility] [address], [address] [place], [place]  [address] [phone]

## 2023-09-30 ENCOUNTER — Telehealth: Payer: Self-pay

## 2023-09-30 ENCOUNTER — Telehealth: Payer: Medicare HMO | Admitting: Neurology

## 2023-09-30 DIAGNOSIS — G4733 Obstructive sleep apnea (adult) (pediatric): Secondary | ICD-10-CM

## 2023-09-30 NOTE — Patient Instructions (Signed)
Continue nightly CPAP usage.  Recommend minimum of 4 hours nightly utilization.  Continue current settings.  Will send an order to DME to send supplies as needed.

## 2023-09-30 NOTE — Telephone Encounter (Signed)
Sent community message.   

## 2023-09-30 NOTE — Telephone Encounter (Signed)
-----   Message from Glean Salvo sent at 09/30/2023  4:37 PM EDT ----- Can you send an order to DME to continue current settings.  Settings are on a CPAP download.  Thanks

## 2023-10-01 NOTE — Addendum Note (Signed)
Addended by: Glean Salvo on: 10/01/2023 04:36 PM   Modules accepted: Orders

## 2023-11-30 ENCOUNTER — Ambulatory Visit: Payer: Medicare HMO | Admitting: Internal Medicine

## 2023-11-30 ENCOUNTER — Encounter: Payer: Self-pay | Admitting: Internal Medicine

## 2023-11-30 VITALS — BP 136/84 | HR 73 | Temp 98.6°F | Resp 14 | Ht 70.91 in | Wt 197.4 lb

## 2023-11-30 DIAGNOSIS — J3089 Other allergic rhinitis: Secondary | ICD-10-CM | POA: Diagnosis not present

## 2023-11-30 DIAGNOSIS — J4530 Mild persistent asthma, uncomplicated: Secondary | ICD-10-CM | POA: Diagnosis not present

## 2023-11-30 MED ORDER — ALBUTEROL SULFATE HFA 108 (90 BASE) MCG/ACT IN AERS: 1.0000 | INHALATION_SPRAY | Freq: Four times a day (QID) | RESPIRATORY_TRACT | 1 refills | Status: AC | PRN

## 2023-11-30 NOTE — Progress Notes (Signed)
NEW PATIENT  Date of Service/Encounter:  11/30/23  Consult requested by: Lindaann Slough, DO   Subjective:   Troy Gaines (DOB: 18-Aug-1951) is a 72 y.o. male who presents to the clinic on 11/30/2023 with a chief complaint of Sinus Problem (Says he wears a Cpap at night and can not use it the length that he should as he has to pull if off due to nostril and breathing issues. ) and Asthma (Says he doing well. See's a pulmonologist and has not had any issues in a long time. ) .    History obtained from: chart review and patient.   Asthma:  Diagnosed in the last 10 years.  Followed by Pulmonology Dr Celine Mans.  He was prescribed Breo at the time when he saw Dr Celine Mans in 2023 as he was having a lot of trouble with frequent cough and sometimes SOB but never started it due to its side effects as he already has trouble with tremors/nervousness.   Sometimes gets a little SOB with activity and bending but not very active recently due to shoulder injury; rarely has these symptoms about once a month or less.  Not much issues with cough anymore.  rarely daytime symptoms in past month, none nighttime awakenings in past month Using rescue inhaler: rarely, once a month. Limitations to daily activity: none 0 ED visits/UC visits and 0 oral steroids in the past year 0 number of lifetime hospitalizations, 0 number of lifetime intubations.  Identified Triggers: exercise Prior PFTs or spirometry: in 2021 with obstruction and some BD response. Previously used therapies: none, prescribed Breo but never started it. Current regimen:  Maintenance: none Rescue: Albuterol 2 puffs q4-6 hrs PRN  Rhinitis:  Started Worsened in the past 6 months. He has been using CPAP for about 1 year.   Symptoms include: nasal congestion, rhinorrhea, post nasal drainage, and watery eyes but also suffers from chronic dry eyes. Trouble breathing through his nose  Occurs year-round Potential triggers: not sure but his symptoms  were [place]   Treatments tried:  Saline spray Hydroxyzine PRN   Previous allergy testing: no History of sinus surgery: no Nonallergic triggers: none     Reviewed:  09/08/2023: seen by Dr Jules Husbands for sinus congestion, using CPAP. Discussed use of hydroxyzine at bedtime.  Referred to Allergy. Also has stage 3 CKD.   09/30/2023: seen by Christia Reading NP Neurology for OSA on CPAP. Does have some headaches in AM. Discussed continuation of current pressure settings and to at least use for 4 hours.   06/02/2022: seen by Dr Iverson Alamin for mild persistent asthma.  Having trouble with Cough and SOB with exertion. PFT 2021 with obstruction and positive bronchodilator response. Started on Breo and PRN Albuterol. Discussed follow up in 2 months but no showed/cancelled the appointment.  06/24/2022: seen by Dr Diona Browner cardiology for SOB. Planning to obtain lexican and Echo. Does have hx of HTN and HLD.   Echo 07/08/2022: EF 65-70%, normal pulmonary pressures.  Myoview scan: lexiscan stress without EKG changes, normal perfusion, normal wall motion.  Overall low risk study.  11/16/2020: PFT with obstruction by ratio, some bronchodilator response, normal lung volumes and diffusion.   Past Medical History: Past Medical History:  Diagnosis Date   Arthritis    Asthma    COPD (chronic obstructive pulmonary disease) (HCC)    Essential hypertension    Gout    Mixed hyperlipidemia  Pulmonary embolism (HCC)    2013 postoperative and recurrent 2019 - chronic anticoagulation   Tremor     Past Surgical History: Past Surgical History:  Procedure Laterality Date   ACHILLES TENDON REPAIR  2013   CARDIAC CATHETERIZATION  2013   COLONOSCOPY WITH PROPOFOL N/A 04/09/2021   Procedure: COLONOSCOPY WITH PROPOFOL;  Surgeon: Dolores Frame, MD;  Location: [facility];  Service: Gastroenterology;  Laterality: N/A;  AM   TESTICLE SURGERY  1964    Family  History: Family History  Problem Relation Age of Onset   Allergic rhinitis Mother    Pancreatic cancer Mother    Cardiomyopathy Father    Tremor Father    Allergic rhinitis Brother     Medication List:  Allergies as of 11/30/2023       Reactions   Wound Dressing Adhesive Rash        Medication List        Accurate as of November 30, 2023 11:23 AM. If you have any questions, ask your nurse or doctor.          STOP taking these medications    etodolac 300 MG capsule Commonly known as: LODINE Stopped by: Birder Robson   Lutein-Zeaxanthin Tabs Stopped by: Birder Robson       TAKE these medications    albuterol 108 (90 Base) MCG/ACT inhaler Commonly known as: VENTOLIN HFA Inhale 2 puffs into the lungs every 6 (six) hours as needed for wheezing or shortness of breath.   allopurinol 100 MG tablet Commonly known as: ZYLOPRIM Take 100 mg by mouth daily.   atorvastatin 20 MG tablet Commonly known as: LIPITOR Take 20 mg by mouth at bedtime.   B COMPLEX-VITAMIN B12 PO Take 1 tablet by mouth 3 (three) times a week.   cholecalciferol 25 MCG (1000 UNIT) tablet Commonly known as: VITAMIN D3 Take 1,000 Units by mouth daily.   hydrocortisone cream 1 % Apply 1 application topically daily as needed for itching.   Magnesium Glycinate 120 MG Caps Take 1 tablet by mouth daily.   metoprolol succinate 100 MG 24 hr tablet Commonly known as: TOPROL-XL Take 1 tablet by mouth daily. What changed: Another medication with the same name was removed. Continue taking this medication, and follow the directions you see here. Changed by: Birder Robson   ramipril 5 MG capsule Commonly known as: ALTACE Take 1 capsule (5 mg total) by mouth daily. What changed: how much to take   tamsulosin 0.4 MG Caps capsule Commonly known as: FLOMAX Take 0.4 mg by mouth at bedtime.   Xarelto 20 MG Tabs tablet Generic drug: rivaroxaban Take 20 mg by mouth at bedtime.         REVIEW  OF SYSTEMS: Pertinent positives and negatives discussed in HPI.   Objective:   Physical Exam: BP 136/84   Pulse 73   Temp 98.6 F (37 C) (Temporal)   Resp 14   Ht 5' 10.91" (1.801 m)   Wt 197 lb 6.4 oz (89.5 kg)   SpO2 97%   BMI 27.60 kg/m  Body mass index is 27.6 kg/m. GEN: alert, well developed HEENT: clear conjunctiva, nose with + mild inferior turbinate hypertrophy, pink nasal mucosa, slight clear rhinorrhea, slight cobblestoning HEART: regular rate and rhythm, no murmur LUNGS: clear to auscultation bilaterally, no coughing, unlabored respiration ABDOMEN: soft, non distended  SKIN: no rashes or lesions   Spirometry:  Tracings reviewed. His effort: It was hard to get consistent efforts  and there is a question as to whether this reflects a maximal maneuver. FVC: 3.46L, 97% predicted; post 3.10L, 86% FEV1: 1.81L, 66% predicted; post 1.93L, 71% FEV1/FVC ratio: 52% Interpretation: Spirometry consistent with moderate obstructive disease.  No significant reversibility.   Please see scanned spirometry results for details.  Assessment:   1. Other allergic rhinitis   2. Mild persistent asthma without complication     Plan/Recommendations:  Other Allergic Rhinitis: - Due to turbinate hypertrophy and unresponsive to over the counter meds, will perform skin testing to identify aeroallergen triggers.   - Use nasal saline rinses before nose sprays such as with Neilmed Sinus Rinse.  Use distilled water.   - Hold all anti-histamines (Xyzal, Allegra, Zyrtec, Claritin, Benadryl) 3 days prior to next visit.    Mild Persistent Asthma: - MDI technique discussed.  Spirometry today with obstruction but no reversibility, not the best effort.  For now, symptomatically he is doing well.  If symptoms worsen, will discuss addition of ICS.   - Followed by Pulm also- Dr Celine Mans.  - Maintenance inhaler: none - Rescue inhaler: Albuterol 2 puffs every 4-6 hours as needed for respiratory symptoms of  shortness of breath, or wheezing Asthma control goals:  Full participation in all desired activities (may need albuterol before activity) Albuterol use two times or less a week on average (not counting use with activity) Cough interfering with sleep two times or less a month Oral steroids no more than once a year No hospitalizations   Follow up: 12/9 at 9:00 AM for skin testing 1-55    No follow-ups on file.  Alesia Morin, MD [facility]

## 2023-11-30 NOTE — Patient Instructions (Addendum)
Other Allergic Rhinitis: - Use nasal saline rinses before nose sprays such as with Neilmed Sinus Rinse.  Use distilled water.   - Hold all anti-histamines (Xyzal, Allegra, Zyrtec, Claritin, Benadryl) 3 days prior to next visit.   Mild Persistent Asthma: - Followed by Pulm Dr. Celine Mans - Maintenance inhaler: none - Rescue inhaler: Albuterol 2 puffs every 4-6 hours as needed for respiratory symptoms of shortness of breath, or wheezing Asthma control goals:  Full participation in all desired activities (may need albuterol before activity) Albuterol use two times or less a week on average (not counting use with activity) Cough interfering with sleep two times or less a month Oral steroids no more than once a year No hospitalizations   Follow up: 12/9 at 9:00 AM for skin testing 1-55

## 2023-12-07 ENCOUNTER — Ambulatory Visit: Payer: Medicare HMO | Admitting: Internal Medicine

## 2023-12-07 DIAGNOSIS — J3089 Other allergic rhinitis: Secondary | ICD-10-CM | POA: Diagnosis not present

## 2023-12-07 DIAGNOSIS — J301 Allergic rhinitis due to pollen: Secondary | ICD-10-CM

## 2023-12-07 MED ORDER — CETIRIZINE HCL 10 MG PO TABS: 10.0000 mg | ORAL_TABLET | Freq: Every day | ORAL | 5 refills | Status: AC | PRN

## 2023-12-07 MED ORDER — FLUTICASONE PROPIONATE 50 MCG/ACT NA SUSP: 2.0000 | Freq: Every day | NASAL | 5 refills | Status: AC

## 2023-12-07 NOTE — Patient Instructions (Addendum)
Allergic Rhinitis: - Positive skin test 11/2023: trees, grasses, weeds, molds  - Avoidance measures discussed. - Use nasal saline rinses before nose sprays such as with Neilmed Sinus Rinse.  Use distilled water.   - Use Flonase 2 sprays each nostril daily. Aim upward and outward. - Use Zyrtec 10 mg daily as needed for drainage, runny nose, sneezing, itchy watery eyes.   - Consider allergy shots as long term control of your symptoms by teaching your immune system to be more tolerant of your allergy triggers  Mild Persistent Asthma: - Followed by Pulm Dr. Celine Mans - Maintenance inhaler: none - Rescue inhaler: Albuterol 2 puffs every 4-6 hours as needed for respiratory symptoms of shortness of breath, or wheezing Asthma control goals:  Full participation in all desired activities (may need albuterol before activity) Albuterol use two times or less a week on average (not counting use with activity) Cough interfering with sleep two times or less a month Oral steroids no more than once a year No hospitalizations   ALLERGEN AVOIDANCE MEASURES   Molds - Indoor avoidance Use air conditioning to reduce indoor humidity.  Do not use a humidifier. Keep indoor humidity at 30 - 40%.  Use a dehumidifier if needed. In the bathroom use an exhaust fan or open a window after showering.  Wipe down damp surfaces after showering.  Clean bathrooms with a mold-killing solution (diluted bleach, or products like Tilex, etc) at least once a month. In the kitchen use an exhaust fan to remove steam from cooking.  Throw away spoiled foods immediately, and empty garbage daily.  Empty water pans below self-defrosting refrigerators frequently. Vent the clothes dryer to the outside. Limit indoor houseplants; mold grows in the dirt.  No houseplants in the bedroom. Remove carpet from the bedroom. Encase the mattress and box springs with a zippered encasing.  Molds - Outdoor avoidance Avoid being outside when the grass is  being mowed, or the ground is tilled. Avoid playing in leaves, pine straw, hay, etc.  Dead plant materials contain mold. Avoid going into barns or grain storage areas. Remove leaves, clippings and compost from around the home. Pollen Avoidance Pollen levels are highest during the mid-day and afternoon.  Consider this when planning outdoor activities. Avoid being outside when the grass is being mowed, or wear a mask if the pollen-allergic person must be the one to mow the grass. Keep the windows closed to keep pollen outside of the home. Use an air conditioner to filter the air. Take a shower, wash hair, and change clothing after working or playing outdoors during pollen season.

## 2023-12-07 NOTE — Progress Notes (Signed)
FOLLOW UP Date of Service/Encounter:  12/07/23   Subjective:  Troy Gaines (DOB: 16-Jul-1951) is a 72 y.o. male [facility] for follow up for skin testing.   History obtained from: chart review and patient.  Anti histamines held.   Past Medical History: Past Medical History:  Diagnosis Date   Arthritis    Asthma    COPD (chronic obstructive pulmonary disease) (HCC)    Essential hypertension    Gout    Mixed hyperlipidemia    Pulmonary embolism (HCC)    2013 postoperative and recurrent 2019 - chronic anticoagulation   Tremor     Objective:  There were no vitals taken for this visit. There is no height or weight on file to calculate BMI. Physical Exam: GEN: alert, well developed HEENT: clear conjunctiva, MMM LUNGS: unlabored respiration Skin Testing:  Skin prick testing was placed, which includes aeroallergens/foods, histamine control, and saline control.  Verbal consent was obtained prior to placing test.  Patient tolerated procedure well.  Allergy testing results were read and interpreted by myself, documented by clinical staff. Adequate positive and negative control.  Positive results to:  Results discussed with patient/family.  Airborne Adult Perc - 12/07/23 0901     Time Antigen Placed 0901    Allergen Manufacturer Waynette Buttery    Location Back    Number of Test 55    1. Control-Buffer 50% Glycerol Negative    2. Control-Histamine 3+    3. Bahia Negative    4. [place] Negative    5. Johnson Negative    6. [place] Blue 3+    7. Meadow Fescue 3+    8. Perennial Rye 3+    9. Timothy 3+    10. Ragweed Mix 2+    11. Cocklebur 3+    12. Plantain,  English Negative    13. Baccharis Negative    14. Dog Fennel Negative    15. Russian Thistle 3+    16. Lamb's Quarters Negative    17. Sheep Sorrell 3+    18. Rough Pigweed 3+    19. Marsh Elder, Rough Negative    20. Mugwort, Common 3+    21. Box, Elder 3+    22.  Cedar, red 2+    23. Sweet Gum Negative    24. Pecan Pollen 3+    25. Pine Mix Negative    26. Walnut, Black Pollen Negative    27. Red Mulberry 3+    28. Ash Mix 3+    29. Birch Mix 3+    30. Beech American 3+    31. Cottonwood, Eastern 3+    32. Hickory, White 3+    33. Maple Mix Negative    34. Oak, [place] Mix 3+    35. Sycamore Eastern 2+    36. Alternaria Alternata 3+    37. Cladosporium Herbarum 3+    38. Aspergillus Mix Negative    39. Penicillium Mix Negative    40. Bipolaris Sorokiniana (Helminthosporium) 3+    41. Drechslera Spicifera (Curvularia) 3+    42. Mucor Plumbeus Negative    43. Fusarium Moniliforme 3+    44. Aureobasidium Pullulans (pullulara) Negative    45. Rhizopus Oryzae Negative    46. Botrytis Cinera Negative    47. Epicoccum Nigrum 3+    48. Phoma Betae 3+    49. Dust Mite Mix Negative    50. Cat Hair 10,000 BAU/ml Negative    51.  Dog Epithelia Negative    52. Mixed Feathers Negative    53. Horse Epithelia Negative    54. Cockroach, German Negative    55. Tobacco Leaf Negative              Assessment:   1. Seasonal allergic rhinitis due to pollen   2. Allergic rhinitis caused by mold     Plan/Recommendations:   Allergic Rhinitis: - Due to turbinate hypertrophy and unresponsive to over the counter meds, will perform skin testing to identify aeroallergen triggers.   - Positive skin test 11/2023: trees, grasses, weeds, molds  - Avoidance measures discussed. - Use nasal saline rinses before nose sprays such as with Neilmed Sinus Rinse.  Use distilled water.   - Use Flonase 2 sprays each nostril daily. Aim upward and outward. - Use Zyrtec 10 mg daily as needed for drainage, runny nose, sneezing, itchy watery eyes.   - Consider allergy shots as long term control of your symptoms by teaching your immune system to be more tolerant of your allergy triggers  Mild Persistent Asthma: - Followed by Pulm Dr. Celine Mans - Maintenance inhaler:  none - Rescue inhaler: Albuterol 2 puffs every 4-6 hours as needed for respiratory symptoms of shortness of breath, or wheezing Asthma control goals:  Full participation in all desired activities (may need albuterol before activity) Albuterol use two times or less a week on average (not counting use with activity) Cough interfering with sleep two times or less a month Oral steroids no more than once a year No hospitalizations   ALLERGEN AVOIDANCE MEASURES   Molds - Indoor avoidance Use air conditioning to reduce indoor humidity.  Do not use a humidifier. Keep indoor humidity at 30 - 40%.  Use a dehumidifier if needed. In the bathroom use an exhaust fan or open a window after showering.  Wipe down damp surfaces after showering.  Clean bathrooms with a mold-killing solution (diluted bleach, or products like Tilex, etc) at least once a month. In the kitchen use an exhaust fan to remove steam from cooking.  Throw away spoiled foods immediately, and empty garbage daily.  Empty water pans below self-defrosting refrigerators frequently. Vent the clothes dryer to the outside. Limit indoor houseplants; mold grows in the dirt.  No houseplants in the bedroom. Remove carpet from the bedroom. Encase the mattress and box springs with a zippered encasing.  Molds - Outdoor avoidance Avoid being outside when the grass is being mowed, or the ground is tilled. Avoid playing in leaves, pine straw, hay, etc.  Dead plant materials contain mold. Avoid going into barns or grain storage areas. Remove leaves, clippings and compost from around the home. Pollen Avoidance Pollen levels are highest during the mid-day and afternoon.  Consider this when planning outdoor activities. Avoid being outside when the grass is being mowed, or wear a mask if the pollen-allergic person must be the one to mow the grass. Keep the windows closed to keep pollen outside of the home. Use an air conditioner to filter the air. Take  a shower, wash hair, and change clothing after working or playing outdoors during pollen season.    Return in about 6 weeks (around 01/18/2024).  Alesia Morin, MD [facility]

## 2023-12-16 ENCOUNTER — Ambulatory Visit: Payer: Medicare HMO | Admitting: Orthopaedic Surgery

## 2023-12-16 ENCOUNTER — Encounter: Payer: Self-pay | Admitting: Orthopaedic Surgery

## 2023-12-16 ENCOUNTER — Other Ambulatory Visit (INDEPENDENT_AMBULATORY_CARE_PROVIDER_SITE_OTHER): Payer: Medicare HMO

## 2023-12-16 DIAGNOSIS — G8929 Other chronic pain: Secondary | ICD-10-CM | POA: Diagnosis not present

## 2023-12-16 DIAGNOSIS — M545 Low back pain, unspecified: Secondary | ICD-10-CM | POA: Diagnosis not present

## 2023-12-16 MED ORDER — PREDNISONE 10 MG (21) PO TBPK: ORAL_TABLET | ORAL | 3 refills | Status: AC

## 2023-12-16 NOTE — Progress Notes (Signed)
Office Visit Note   Patient: Troy Gaines           Date of Birth: 22-Apr-1951           MRN: [id number] Visit Date: 12/16/2023              Requested by: Lindaann Slough, DO [address] [place],  [place] [address] PCP: Lindaann Slough, DO   Assessment & Plan: Visit Diagnoses:  1. Chronic midline low back pain without sciatica     Plan: Impression 72 year old gentleman with low back pain facet disease and spondylosis.  Will send him to outpatient PT and prescribe prednisone Dosepak.  Cannot take NSAIDs due to Xarelto.  Follow-Up Instructions: No follow-ups on file.   Orders:  Orders Placed This Encounter  Procedures   XR Lumbar Spine 2-3 Views   Meds ordered this encounter  Medications   predniSONE (STERAPRED UNI-PAK 21 TAB) 10 MG (21) TBPK tablet    Sig: Take as directed    Dispense:  21 tablet    Refill:  3      Procedures: No procedures performed   Clinical Data: No additional findings.   Subjective: Chief Complaint  Patient presents with   Lower Back - Pain    HPI Troy Gaines is a 72 year old gentleman whose here for evaluation of low back pain for 2 months.  He denies any radicular symptoms or red flag symptoms.  He reports start up stiffness after prolonged sitting.  Review of Systems  Constitutional: Negative.   HENT: Negative.    Eyes: Negative.   Respiratory: Negative.    Cardiovascular: Negative.   Gastrointestinal: Negative.   Endocrine: Negative.   Genitourinary: Negative.   Musculoskeletal:  Positive for back pain.  Skin: Negative.   Allergic/Immunologic: Negative.   Neurological: Negative.   Hematological: Negative.   Psychiatric/Behavioral: Negative.    All other systems reviewed and are negative.    Objective: Vital Signs: There were no vitals taken for this visit.  Physical Exam Vitals and nursing note reviewed.  Constitutional:      Appearance: He is well-developed.  Pulmonary:     Effort: Pulmonary effort is  normal.  Abdominal:     Palpations: Abdomen is soft.  Skin:    General: Skin is warm.  Neurological:     Mental Status: He is alert and oriented to person, place, and time.  Psychiatric:        Behavior: Behavior normal.        Thought Content: Thought content normal.        Judgment: Judgment normal.     Ortho Exam Exam of the lumbar spine is nonfocal.  No groin pain. Specialty Comments:  No specialty comments available.  Imaging: XR Lumbar Spine 2-3 Views Result Date: 12/16/2023 X-rays of the lumbar spine shows diffuse facet disease and spondylosis.  No acute abnormalities.    PMFS History: Patient Active Problem List   Diagnosis Date Noted   OSA (obstructive sleep apnea) 12/18/2022   Excessive daytime sleepiness 10/14/2021   Primary osteoarthritis of right knee 01/01/2021   Primary osteoarthritis of left knee 01/01/2021   Primary osteoarthritis of left hip 01/01/2021   Primary osteoarthritis of right hip 01/01/2021   Benign essential tremor 02/04/2018   Saddle embolism of pulmonary artery- bilateral 09/03/2012   Hypertension 09/03/2012   Hyperlipidemia 09/03/2012   CKD (chronic kidney disease) stage 3, GFR 30-59 ml/min (HCC) 09/03/2012   History of gout 09/03/2012   Anemia -  baseline hemoglobin unknown 09/03/2012   Dyspnea 09/03/2012   Tachycardia 09/03/2012   Past Medical History:  Diagnosis Date   Arthritis    Asthma    COPD (chronic obstructive pulmonary disease) (HCC)    Essential hypertension    Gout    Mixed hyperlipidemia    Pulmonary embolism (HCC)    2013 postoperative and recurrent 2019 - chronic anticoagulation   Tremor     Family History  Problem Relation Age of Onset   Allergic rhinitis Mother    Pancreatic cancer Mother    Cardiomyopathy Father    Tremor Father    Allergic rhinitis Brother     Past Surgical History:  Procedure Laterality Date   ACHILLES TENDON REPAIR  2013   CARDIAC CATHETERIZATION  2013   COLONOSCOPY WITH PROPOFOL  N/A 04/09/2021   Procedure: COLONOSCOPY WITH PROPOFOL;  Surgeon: Dolores Frame, MD;  [facility];  Service: Gastroenterology;  Laterality: N/A;  AM   TESTICLE SURGERY  1964   Social History   Occupational History   Not on file  Tobacco Use   Smoking status: Former    Types: Cigars    Quit date: 08/18/1991    Years since quitting: 32.3   Smokeless tobacco: Never  Substance and Sexual Activity   Alcohol use: Yes    Comment: occasional   Drug use: No   Sexual activity: Yes

## 2024-02-17 ENCOUNTER — Ambulatory Visit: Payer: Medicare HMO | Admitting: Family Medicine

## 2024-03-17 ENCOUNTER — Encounter: Payer: Self-pay | Admitting: Neurology

## 2024-03-17 DIAGNOSIS — G4733 Obstructive sleep apnea (adult) (pediatric): Secondary | ICD-10-CM

## 2024-03-21 NOTE — Telephone Encounter (Signed)
 New pressure setting order has been sent to pt's DME [facility].

## 2024-03-21 NOTE — Addendum Note (Signed)
 Addended by: Berna Spare A on: 03/21/2024 09:35 AM   Modules accepted: Orders

## 2024-03-21 NOTE — Telephone Encounter (Signed)
 I lowered his CPAP pressure from 5-11 cm to 5-10 cm pressure due to report of feeling chest tightness when wearing CPAP. He will let me know how this change does.

## 2024-05-27 NOTE — Progress Notes (Signed)
 Troy Gaines

## 2024-05-30 ENCOUNTER — Ambulatory Visit: Payer: Medicare HMO | Admitting: Neurology

## 2024-05-30 ENCOUNTER — Encounter: Payer: Self-pay | Admitting: Neurology

## 2024-05-30 VITALS — BP 135/74 | HR 87 | Ht 72.0 in | Wt 206.0 lb

## 2024-05-30 DIAGNOSIS — R42 Dizziness and giddiness: Secondary | ICD-10-CM

## 2024-05-30 DIAGNOSIS — I10 Essential (primary) hypertension: Secondary | ICD-10-CM

## 2024-05-30 DIAGNOSIS — G4733 Obstructive sleep apnea (adult) (pediatric): Secondary | ICD-10-CM | POA: Diagnosis not present

## 2024-05-30 DIAGNOSIS — G250 Essential tremor: Secondary | ICD-10-CM | POA: Diagnosis not present

## 2024-05-30 NOTE — Progress Notes (Signed)
 [facility]  PATIENT: Troy Gaines DOB: [birth year]  REFERRING DOCTOR OR PCP: Troy Grinder, MD SOURCE: Patient, notes from primary care  _________________________________   HISTORICAL  CHIEF COMPLAINT:  Chief Complaint  Patient presents with   RM11/CPAP    Pt is here Alone. Pt states that things have been not too bad with his CPAP Machine. Pt states that he is needing a new mask. Pt states that he doesn't understand his reports and would like to discuss them in better detail. Pt states that since lowering his pressure on his CPAP Machine he hasn't had chest pain. Pt states that he has balance issues. Pt states that when he is moving slowly he has balance issues. Pt states that he has short term memory issues. Pt states that his tremor is worse.ESS 13.     HISTORY OF PRESENT ILLNESS:  Troy Gaines, at [facility] for neurologic consultation regarding his fatigue and poor sleep.  Update 05/30/2024 OSA and other sleep issues: He was reporting fatigue and sleepiness.   He sometimes falls asleep when reading or watching TV.   He has a strong FH of OSA (father, mother, brother and one sister)      We did a sleep study December 2022.  It showed moderate OSA with an AHI 17.4.  He started AutoPap.  He is currently doing well.  Download shows 80% 4 hour compliance.  AHI is excellent at only 1.0/h.    He had some chest pain, better with lower pressure.  He denies insomnia.Troy Gaines  He lives alone and is not sure how loud his snoring is or if he has any pauses, gasping or snorting.  He wakes up 0-3 times a night sometimes to urinate.  He will quickly fall back asleep.   He does not feel refreshed in morning.  He sometimes has a headache upon awakening.   He has some daytie somnolence but this is not bothersome.  Not occurring when driving.      EPWORTH SLEEPINESS SCALE  On a scale of 0 - 3 what is the chance of dozing:  Sitting and  Reading:   3 Watching TV:    2 Sitting inactive in a public place: 1 Passenger in car for one hour: 2 Lying down to rest in the afternoon: 3 Sitting and talking to someone: 1 Sitting quietly after lunch:  1 In a car, stopped in traffic:  0  Total (out of 24):   13/24   mild EDS.   Lightheadedness: The spells of lightheadedness have improved and now rare   He had this in 2024:    "He denies vertigo.   This usually occurs with standing and only rarely while sitting.  He notes symptoms can be random while standingand not necessarily occurring shortly after standing from a seated or lying position.     He feels lightheaded x 5-10 seconds at a time anywhere from 1-3 times a day   He does nt feel he will pass out.   No other symptoms during the episode - no numbness, tingling, HA, nausea.     He is on ramipril  and metoprolol  for HTN and tremor.     He also notes independent mild pressure sensation in the head that is fleeting just seconds at a time.  These are not common and do not occur associated with lightheadedness"        Tremor: He also has essential tremor.  It increases when he holds something or  writes.  Also worse if nervous.  He is on metoprolol  with mid benefit   The tremor is predominantly on the left and he is right-handed so it has not affected his handwriting or other fine motor tasks.  He is on Xarelto  for bilateral PE.  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     Other:  He has essential hypertension. And hyperlipidemia.  .       EPWORTH SLEEPINESS SCALE  On a scale of 0 - 3 what is the chance of dozing:  Sitting and Reading:   1 Watching TV:    1 Sitting inactive in a public place: 1 Passenger in car for one hour: 0 Lying down to rest in the afternoon: 3 Sitting and talking to someone: 0 Sitting quietly after lunch:  1 In a car, stopped in traffic:  0  Total (out of 24):     7/24   normal sleepiness  HST 12/02/2021 This home sleep study  shows the following: Moderate OSA with a pAHI = 17.4/h Normal sleep efficiency   Labs: 03/31/2023:  CMP and lipid panel unremarkable   REVIEW OF SYSTEMS: Constitutional: No fevers, chills, sweats, or change in appetite.  He has fatigue and sleepiness Eyes: No visual changes, double vision, eye pain Ear, nose and throat: No hearing loss, ear pain, nasal congestion, sore throat Cardiovascular: No chest pain, palpitations Respiratory:  No shortness of breath at rest or with exertion.   No wheezes GastrointestinaI: No nausea, vomiting, diarrhea, abdominal pain, fecal incontinence Genitourinary: He has urinary hesitancy.  Chronic kidney disease. Musculoskeletal: He has hip and back pain Integumentary: No rash, pruritus, skin lesions Neurological: as above Psychiatric: No depression at this time.  No anxiety Endocrine: No palpitations, diaphoresis, change in appetite, change in weigh or increased thirst Hematologic/Lymphatic:  No anemia, purpura, petechiae.  He is on Xarelto  for pulmonary embolus Allergic/Immunologic: No itchy/runny eyes, nasal congestion, recent allergic reactions, rashes  ALLERGIES: Allergies  Allergen Reactions   Wound Dressing Adhesive Rash    HOME MEDICATIONS:  Current Outpatient Medications:    albuterol  (VENTOLIN  HFA) 108 (90 Base) MCG/ACT inhaler, Inhale 1-2 puffs into the lungs every 6 (six) hours as needed for wheezing or shortness of breath., Disp: 8 g, Rfl: 1   allopurinol (ZYLOPRIM) 100 MG tablet, Take 100 mg by mouth daily., Disp: , Rfl:    atorvastatin (LIPITOR) 20 MG tablet, Take 20 mg by mouth at bedtime., Disp: , Rfl:    B Complex-Folic Acid (B COMPLEX-VITAMIN B12 PO), Take 1 tablet by mouth 3 (three) times a week., Disp: , Rfl:    cetirizine  (ZYRTEC  ALLERGY ) 10 MG tablet, Take 1 tablet (10 mg total) by mouth daily as needed for allergies., Disp: 30 tablet, Rfl: 5   cholecalciferol (VITAMIN D3) 25 MCG (1000 UNIT) tablet, Take 1,000 Units by mouth  daily., Disp: , Rfl:    fluticasone  (FLONASE ) 50 MCG/ACT nasal spray, Place 2 sprays into both nostrils daily., Disp: 16 g, Rfl: 5   hydrocortisone cream 1 %, Apply 1 application topically daily as needed for itching., Disp: , Rfl:    Magnesium Glycinate 120 MG CAPS, Take 1 tablet by mouth daily., Disp: , Rfl:    metoprolol  succinate (TOPROL -XL) 100 MG 24 hr tablet, Take 1 tablet by mouth daily., Disp: , Rfl:    ofloxacin (FLOXIN) 0.3 % OTIC solution, ADMINISTER 5 DROPS INTO BOTH EARS DAILY FOR 7 DAYS., Disp: , Rfl:    predniSONE  (STERAPRED UNI-PAK 21 TAB)  10 MG (21) TBPK tablet, Take as directed, Disp: 21 tablet, Rfl: 3   Tamsulosin  HCl (FLOMAX ) 0.4 MG CAPS, Take 0.4 mg by mouth at bedtime., Disp: , Rfl:    XARELTO  20 MG TABS tablet, Take 20 mg by mouth at bedtime., Disp: , Rfl: 1   ramipril  (ALTACE ) 5 MG capsule, Take 1 capsule (5 mg total) by mouth daily. (Patient taking differently: Take 10 mg by mouth daily.), Disp: 90 capsule, Rfl: 3  PAST MEDICAL HISTORY: Past Medical History:  Diagnosis Date   Arthritis    Asthma    COPD (chronic obstructive pulmonary disease) (HCC)    Essential hypertension    Gout    Mixed hyperlipidemia    Pulmonary embolism (HCC)    2013 postoperative and recurrent 2019 - chronic anticoagulation   Tremor     PAST SURGICAL HISTORY: Past Surgical History:  Procedure Laterality Date   ACHILLES TENDON REPAIR  2013   CARDIAC CATHETERIZATION  2013   COLONOSCOPY WITH PROPOFOL  N/A 04/09/2021   Procedure: COLONOSCOPY WITH PROPOFOL ;  Surgeon: Urban Garden, MD;  [facility]: [facility];  Service: Gastroenterology;  Laterality: N/A;  AM   TESTICLE SURGERY  1964    FAMILY HISTORY: Family History  Problem Relation Age of Onset   Allergic rhinitis Mother    Pancreatic cancer Mother    Cardiomyopathy Father    Tremor Father    Allergic rhinitis Brother     SOCIAL HISTORY:  Social History   Socioeconomic History   Marital status: Single     Spouse name: Not on file   Number of children: Not on file   Years of education: Not on file   Highest education level: Not on file  Occupational History   Not on file  Tobacco Use   Smoking status: Former    Types: Cigars    Quit date: 08/18/1991    Years since quitting: 32.8   Smokeless tobacco: Never  Substance and Sexual Activity   Alcohol use: Yes    Comment: occasional   Drug use: No   Sexual activity: Yes  Other Topics Concern   Not on file  Social History Narrative   Not on file   Social Drivers of Health   Financial Resource Strain: Low Risk  (02/08/2024)   Received from [facility]   Overall Financial Resource Strain (CARDIA)    Difficulty of Paying Living Expenses: Not hard at all  Food Insecurity: No Food Insecurity (02/08/2024)   Received from [facility]   Hunger Vital Sign    Worried About Running Out of Food in the Last Year: Never true    Ran Out of Food in the Last Year: Never true  Transportation Needs: No Transportation Needs (02/08/2024)   Received from [facility] - Transportation    Lack of Transportation (Medical): No    Lack of Transportation (Non-Medical): No  Physical Activity: Sufficiently Active (02/08/2024)   Received from [facility]   Exercise Vital Sign    Days of Exercise per Week: 3 days    Minutes of Exercise per Session: 70 min  Stress: No Stress Concern Present (02/08/2024)   Received from [facility] - Occupational Stress Questionnaire    Feeling of Stress : Only a little  Social Connections: Socially Isolated (02/08/2024)   Received from [facility]   Social Connection and Isolation Panel [NHANES]    Frequency of  Communication with Friends and Family: More than three times a week    Frequency of Social Gatherings with Friends and Family: More than three times a week    Attends Religious Services: Never    Database administrator or Organizations: No     Attends Banker Meetings: Never    Marital Status: Divorced  Catering manager Violence: Not At Risk (02/08/2024)   Received from [facility]   Humiliation, Afraid, Rape, and Kick questionnaire    Fear of Current or Ex-Partner: No    Emotionally Abused: No    Physically Abused: No    Sexually Abused: No     PHYSICAL EXAM  Vitals:   05/30/24 1000  BP: 135/74  Pulse: 87  Weight: 206 lb (93.4 kg)  Height: 6' (1.829 m)   Vitals:   05/30/24 1000  BP: 135/74  Pulse: 87    ORTHOSTATIC Laying down   148/88  72 Sitting          145/90    72 Standing 30 s    145/90   76 Standing 90 s     145/88   80   Body mass index is 27.94 kg/m.   General: The patient is well-developed and well-nourished and in no acute distress  Neurologic Exam  Mental status: The patient is alert and oriented x 3 at the time of the examination. The patient has apparent normal recent and remote memory, with an apparently normal attention span and concentration ability.   Speech is normal.  Cranial nerves: Very minimal essential tremor in left arm.  Extraocular movements are full.  Facial strength is normal. No obvious hearing deficits are noted.  Motor:  Very mild left hand tremor with intention.  Muscle bulk is normal.   Tone is normal. Strength is  5 / 5 in all 4 extremities.   Sensory: Sensory testing is intact to pinprick, soft touch and vibration sensation in all 4 extremities.  Coordination: Cerebellar testing reveals good finger-nose-finger and heel-to-shin bilaterally.  Gait and station: Station is normal.   Gait is normal. Tandem gait is normal. Romberg is negative  Reflexes: Deep tendon reflexes are symmetric and normal bilaterally.         ASSESSMENT AND PLAN  OSA (obstructive sleep apnea)  Intermittent lightheadedness  Benign essential tremor  Primary hypertension   Continue CPAP at current settings.  Excellent control at AHI=1.0.  If EDS worsens consider  modafinil.   Dizzy spells have resolved. Continue metoprolol  for tremor c/w BET, no PD signs.    Follow up 12 months  This visit is part of a comprehensive longitudinal care medical relationship regarding the patients primary diagnosis of OSA and related concerns.  Aliese Brannum A. Godwin Lat, MD, [facility] 05/30/2024, 1:02 PM Certified in Neurology, Clinical Neurophysiology, Sleep Medicine and Neuroimaging  [facility] [address], [address] [place], [place] [address] [phone]

## 2024-09-28 ENCOUNTER — Telehealth: Payer: Medicare HMO | Admitting: Neurology

## 2024-09-29 ENCOUNTER — Ambulatory Visit: Admitting: Neurology

## 2024-09-29 ENCOUNTER — Telehealth: Payer: Self-pay | Admitting: Neurology

## 2024-09-29 NOTE — Telephone Encounter (Signed)
 Called and spoke to pt and was able to cancel appt pt voiced gratitude and understanding

## 2024-09-29 NOTE — Telephone Encounter (Signed)
 Patient on schedule today for CPAP revisit. He just saw Dr. Vear in June 2025 for CPAP. Advised to follow up in 1 year. If he is doing well, we can cancel today's visit. He is scheduled to see Dr. Vear in June 2026. Thanks

## 2024-10-05 ENCOUNTER — Telehealth: Payer: Medicare HMO | Admitting: Neurology

## 2024-10-31 ENCOUNTER — Encounter: Payer: Self-pay | Admitting: Radiology

## 2025-06-01 ENCOUNTER — Ambulatory Visit: Admitting: Neurology
